# Patient Record
Sex: Female | Born: 1950
Health system: Southern US, Community
[De-identification: ages and names within clinical notes are randomized; demographics above are authoritative.]

## PROBLEM LIST (undated history)

## (undated) DIAGNOSIS — I219 Acute myocardial infarction, unspecified: Secondary | ICD-10-CM

## (undated) DIAGNOSIS — E781 Pure hyperglyceridemia: Secondary | ICD-10-CM

## (undated) DIAGNOSIS — T7840XA Allergy, unspecified, initial encounter: Secondary | ICD-10-CM

## (undated) HISTORY — DX: Allergy, unspecified, initial encounter: T78.40XA

## (undated) HISTORY — DX: Acute myocardial infarction, unspecified: I21.9

## (undated) HISTORY — PX: CHOLECYSTECTOMY: SHX55

## (undated) HISTORY — DX: Pure hyperglyceridemia: E78.1

## (undated) HISTORY — PX: BREAST CYST ASPIRATION: SHX578

---

## 1998-06-30 HISTORY — PX: OTHER SURGICAL HISTORY: SHX169

## 1999-08-09 ENCOUNTER — Other Ambulatory Visit: Admission: RE | Admit: 1999-08-09 | Discharge: 1999-08-09 | Payer: Self-pay | Admitting: Family Medicine

## 2000-08-16 ENCOUNTER — Other Ambulatory Visit: Admission: RE | Admit: 2000-08-16 | Discharge: 2000-08-16 | Payer: Self-pay | Admitting: Family Medicine

## 2000-08-23 DIAGNOSIS — M8580 Other specified disorders of bone density and structure, unspecified site: Secondary | ICD-10-CM

## 2001-09-11 ENCOUNTER — Other Ambulatory Visit: Admission: RE | Admit: 2001-09-11 | Discharge: 2001-09-11 | Payer: Self-pay | Admitting: Family Medicine

## 2003-12-09 ENCOUNTER — Other Ambulatory Visit: Payer: Self-pay

## 2004-11-08 ENCOUNTER — Ambulatory Visit: Payer: Self-pay | Admitting: Family Medicine

## 2005-01-25 ENCOUNTER — Ambulatory Visit: Payer: Self-pay | Admitting: Family Medicine

## 2005-01-25 ENCOUNTER — Other Ambulatory Visit: Admission: RE | Admit: 2005-01-25 | Discharge: 2005-01-25 | Payer: Self-pay | Admitting: Family Medicine

## 2005-02-13 ENCOUNTER — Ambulatory Visit: Payer: Self-pay | Admitting: Family Medicine

## 2005-02-17 ENCOUNTER — Ambulatory Visit: Payer: Self-pay | Admitting: Family Medicine

## 2005-02-28 ENCOUNTER — Other Ambulatory Visit: Payer: Self-pay

## 2005-03-01 ENCOUNTER — Ambulatory Visit: Payer: Self-pay | Admitting: Surgery

## 2005-03-20 ENCOUNTER — Ambulatory Visit: Payer: Self-pay | Admitting: Family Medicine

## 2005-07-19 ENCOUNTER — Ambulatory Visit: Payer: Self-pay | Admitting: General Surgery

## 2006-07-31 ENCOUNTER — Ambulatory Visit: Payer: Self-pay | Admitting: General Surgery

## 2006-08-22 ENCOUNTER — Encounter: Payer: Self-pay | Admitting: Family Medicine

## 2006-08-22 ENCOUNTER — Other Ambulatory Visit: Admission: RE | Admit: 2006-08-22 | Discharge: 2006-08-22 | Payer: Self-pay | Admitting: Family Medicine

## 2006-08-22 ENCOUNTER — Ambulatory Visit: Payer: Self-pay | Admitting: Family Medicine

## 2006-08-22 LAB — CONVERTED CEMR LAB: Pap Smear: NORMAL

## 2006-08-30 ENCOUNTER — Encounter: Payer: Self-pay | Admitting: Family Medicine

## 2006-08-30 LAB — CONVERTED CEMR LAB

## 2006-10-26 ENCOUNTER — Ambulatory Visit: Payer: Self-pay | Admitting: Family Medicine

## 2007-02-18 ENCOUNTER — Ambulatory Visit: Payer: Self-pay | Admitting: Family Medicine

## 2007-02-28 HISTORY — PX: GANGLION CYST EXCISION: SHX1691

## 2007-07-09 ENCOUNTER — Ambulatory Visit: Payer: Self-pay | Admitting: Family Medicine

## 2007-07-09 DIAGNOSIS — R1110 Vomiting, unspecified: Secondary | ICD-10-CM

## 2007-07-09 DIAGNOSIS — R197 Diarrhea, unspecified: Secondary | ICD-10-CM

## 2007-07-09 DIAGNOSIS — K219 Gastro-esophageal reflux disease without esophagitis: Secondary | ICD-10-CM

## 2007-07-09 HISTORY — DX: Diarrhea, unspecified: R19.7

## 2007-08-27 ENCOUNTER — Ambulatory Visit: Payer: Self-pay | Admitting: Gastroenterology

## 2007-08-27 ENCOUNTER — Encounter: Payer: Self-pay | Admitting: Family Medicine

## 2007-09-03 ENCOUNTER — Ambulatory Visit: Payer: Self-pay | Admitting: General Surgery

## 2008-04-20 IMAGING — MG UNKNOWN MG STUDY
1 series · 4 of 4 positions shown · non-contrast
Comparison: none

REASON FOR EXAM: Screening mammogram
COMMENTS:

[R CC · right · 4 of 4 slices shown]
[im 1/4]
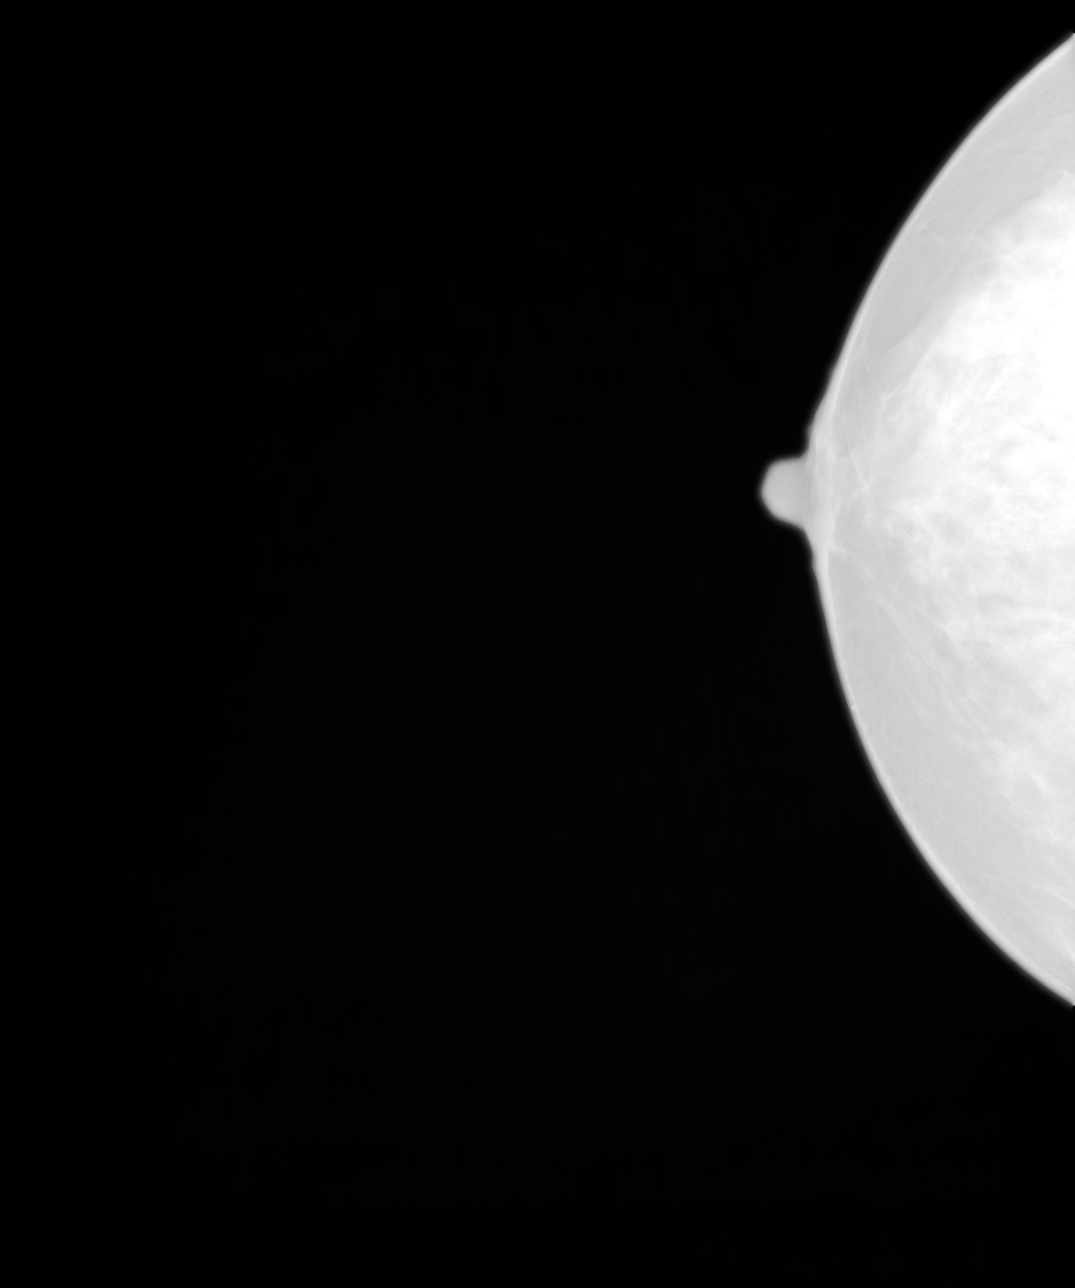
[im 2/4]
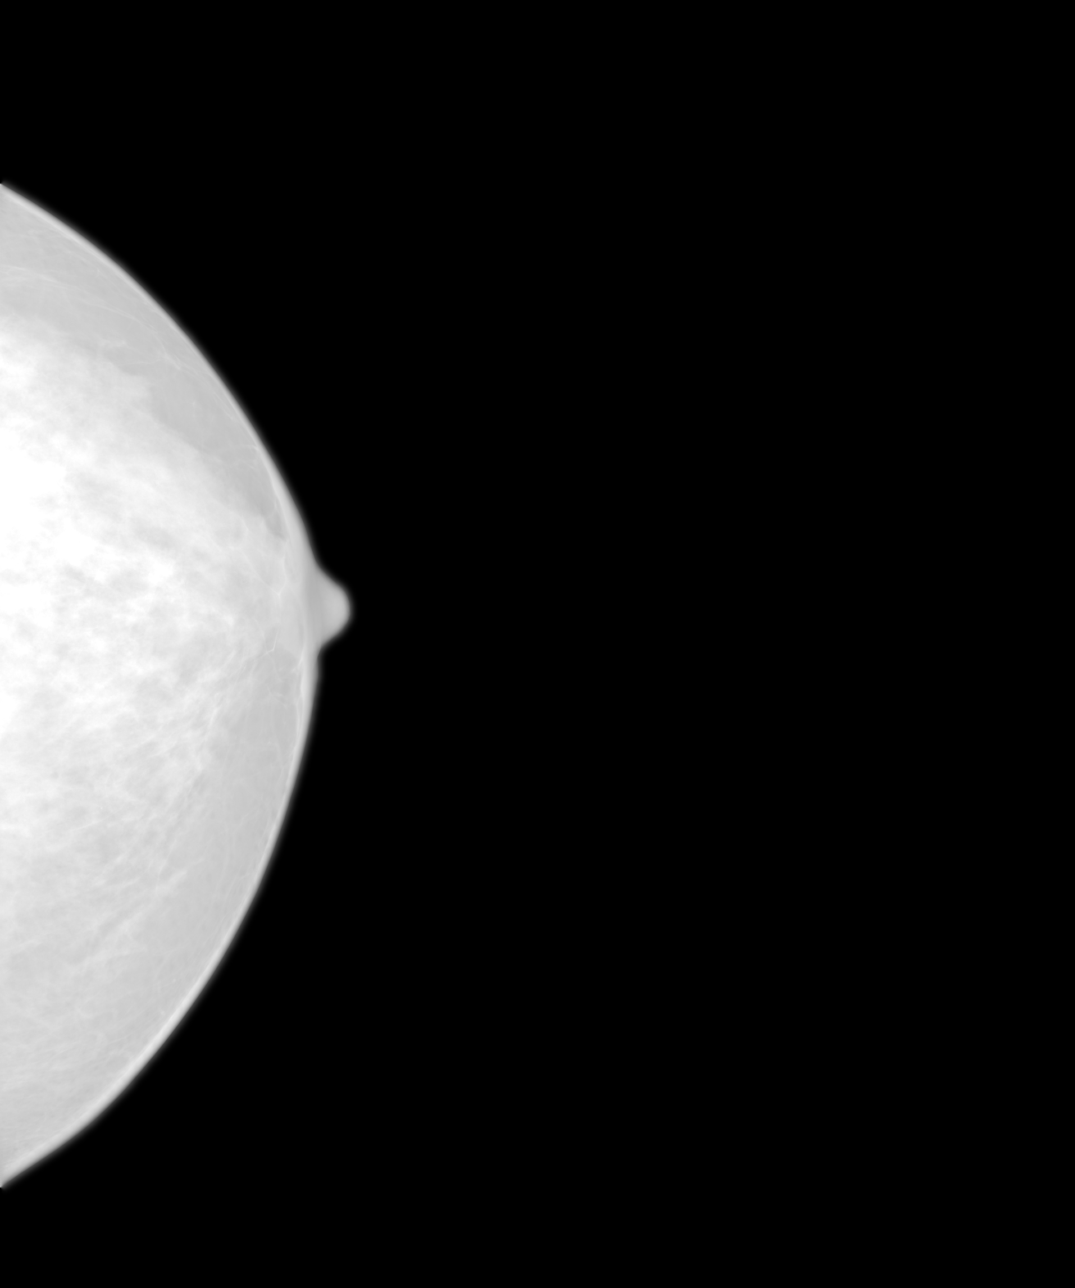
[im 3/4]
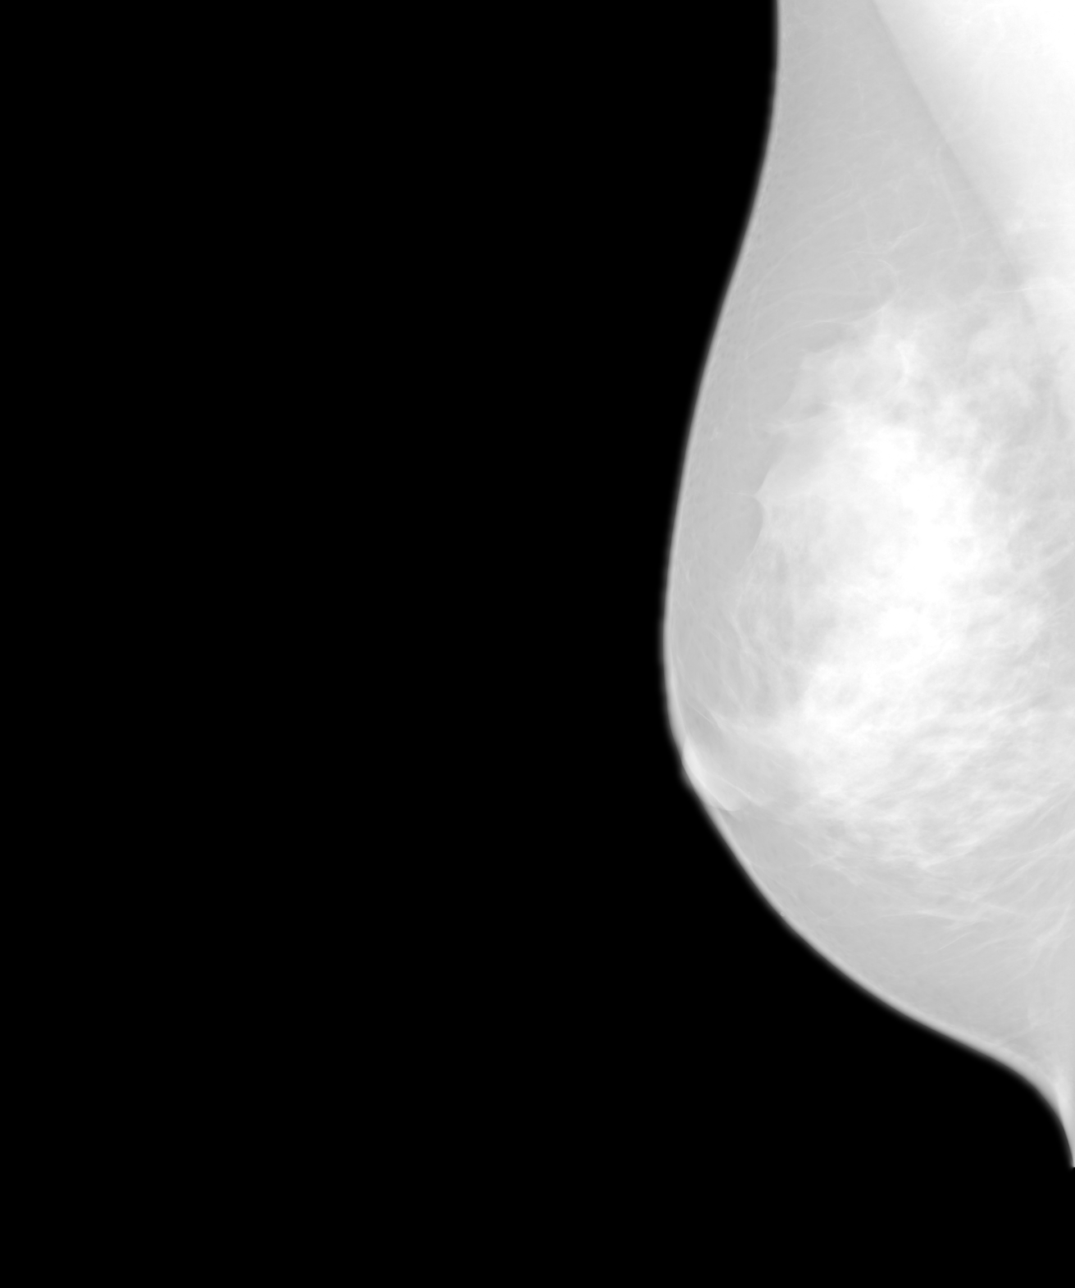
[im 4/4]
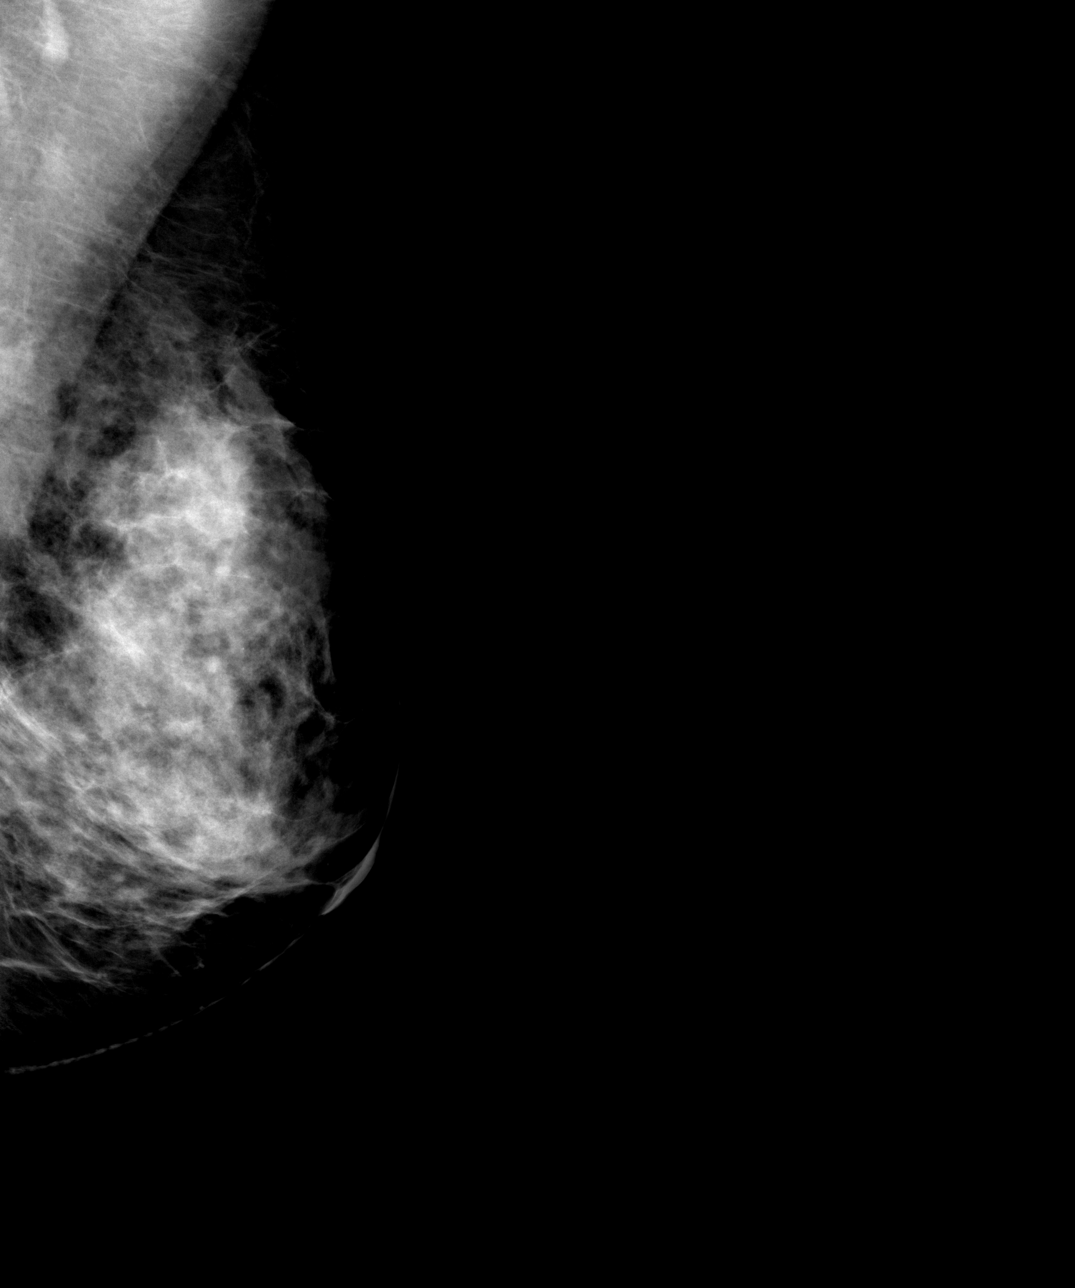

[4 of 4 positions shown; findings below may reference images not displayed]

PROCEDURE:     MAM - MAM DGTL SCREENING MAMMO W/CAD  - July 31, 2006  [DATE]

RESULT:        Comparison is made to a prior digital study 19 July, 2005,09 June, 2004 and 15 May, 2002.  A film screen study 13 May, 1999
is reviewed as well.  The breasts exhibit a moderately dense parenchymal
pattern.  There is no dominant mass.  There are no malignant-appearing
groupings of microcalcification.
IMPRESSION: 1.     I do not see findings suspicious for malignancy.
2.     BI-RADS:  Category 2- Benign Finding.

RECOMMENDATION:   Please continue to encourage yearly mammographic followup.

A NEGATIVE MAMMOGRAM REPORT DOES NOT PRECLUDE BIOPSY OR OTHER EVALUATION OF
A CLINICALLY PALPABLE OR OTHERWISE SUSPICOUS MASS OR LESION.  BREAST CANCER
MAY NOT BE DETECTED BY MAMMOGRAPHY IN UP TO 10% OF CASES.

## 2008-06-16 ENCOUNTER — Encounter (INDEPENDENT_AMBULATORY_CARE_PROVIDER_SITE_OTHER): Payer: Self-pay | Admitting: *Deleted

## 2008-12-01 ENCOUNTER — Ambulatory Visit: Payer: Self-pay | Admitting: General Surgery

## 2008-12-08 ENCOUNTER — Encounter: Payer: Self-pay | Admitting: Family Medicine

## 2009-03-02 ENCOUNTER — Ambulatory Visit: Payer: Self-pay | Admitting: Family Medicine

## 2009-03-02 DIAGNOSIS — I152 Hypertension secondary to endocrine disorders: Secondary | ICD-10-CM | POA: Insufficient documentation

## 2009-03-02 DIAGNOSIS — I10 Essential (primary) hypertension: Secondary | ICD-10-CM

## 2009-03-04 ENCOUNTER — Ambulatory Visit: Payer: Self-pay | Admitting: Family Medicine

## 2009-03-04 ENCOUNTER — Encounter (INDEPENDENT_AMBULATORY_CARE_PROVIDER_SITE_OTHER): Payer: Self-pay | Admitting: Internal Medicine

## 2009-03-05 LAB — CONVERTED CEMR LAB
ALT: 23 units/L (ref 0–35)
AST: 21 units/L (ref 0–37)
Albumin: 3.9 g/dL (ref 3.5–5.2)
Alkaline Phosphatase: 115 units/L (ref 39–117)
BUN: 13 mg/dL (ref 6–23)
Basophils Absolute: 0.1 10*3/uL (ref 0.0–0.1)
Basophils Relative: 0.8 % (ref 0.0–3.0)
Bilirubin, Direct: 0 mg/dL (ref 0.0–0.3)
CO2: 28 meq/L (ref 19–32)
Calcium: 9.1 mg/dL (ref 8.4–10.5)
Chloride: 104 meq/L (ref 96–112)
Cholesterol: 188 mg/dL (ref 0–200)
Creatinine, Ser: 1 mg/dL (ref 0.4–1.2)
Direct LDL: 78.1 mg/dL
Eosinophils Absolute: 0.2 10*3/uL (ref 0.0–0.7)
Eosinophils Relative: 2.4 % (ref 0.0–5.0)
GFR calc non Af Amer: 60.25 mL/min (ref >60)
Glucose, Bld: 108 mg/dL — ABNORMAL HIGH (ref 70–99)
HCT: 38.4 % (ref 36.0–46.0)
HDL: 28.6 mg/dL — ABNORMAL LOW (ref >39.00)
Hemoglobin: 13.5 g/dL (ref 12.0–15.0)
Lymphocytes Relative: 31.1 % (ref 12.0–46.0)
Lymphs Abs: 2.6 10*3/uL (ref 0.7–4.0)
MCHC: 35.1 g/dL (ref 30.0–36.0)
MCV: 89 fL (ref 78.0–100.0)
Monocytes Absolute: 0.8 10*3/uL (ref 0.1–1.0)
Monocytes Relative: 9.4 % (ref 3.0–12.0)
Neutro Abs: 4.7 10*3/uL (ref 1.4–7.7)
Neutrophils Relative %: 56.3 % (ref 43.0–77.0)
Platelets: 359 10*3/uL (ref 150.0–400.0)
Potassium: 4.3 meq/L (ref 3.5–5.1)
RBC: 4.32 M/uL (ref 3.87–5.11)
RDW: 12.1 % (ref 11.5–14.6)
Sodium: 141 meq/L (ref 135–145)
TSH: 1.57 microintl units/mL (ref 0.35–5.50)
Total Bilirubin: 0.8 mg/dL (ref 0.3–1.2)
Total CHOL/HDL Ratio: 7
Total Protein: 7.6 g/dL (ref 6.0–8.3)
Triglycerides: 521 mg/dL — ABNORMAL HIGH (ref 0.0–149.0)
VLDL: 104.2 mg/dL — ABNORMAL HIGH (ref 0.0–40.0)
Vit D, 25-Hydroxy: 19 ng/mL — ABNORMAL LOW (ref 30–89)
WBC: 8.4 10*3/uL (ref 4.5–10.5)

## 2009-03-08 ENCOUNTER — Encounter (INDEPENDENT_AMBULATORY_CARE_PROVIDER_SITE_OTHER): Payer: Self-pay | Admitting: *Deleted

## 2009-03-10 ENCOUNTER — Encounter: Payer: Self-pay | Admitting: Family Medicine

## 2009-03-10 DIAGNOSIS — Z87898 Personal history of other specified conditions: Secondary | ICD-10-CM | POA: Insufficient documentation

## 2009-03-10 DIAGNOSIS — J309 Allergic rhinitis, unspecified: Secondary | ICD-10-CM | POA: Insufficient documentation

## 2009-03-10 DIAGNOSIS — I252 Old myocardial infarction: Secondary | ICD-10-CM | POA: Insufficient documentation

## 2009-03-10 DIAGNOSIS — E781 Pure hyperglyceridemia: Secondary | ICD-10-CM | POA: Insufficient documentation

## 2009-04-01 ENCOUNTER — Ambulatory Visit: Payer: Self-pay | Admitting: Family Medicine

## 2009-04-01 DIAGNOSIS — E559 Vitamin D deficiency, unspecified: Secondary | ICD-10-CM

## 2009-04-01 DIAGNOSIS — E786 Lipoprotein deficiency: Secondary | ICD-10-CM | POA: Insufficient documentation

## 2009-04-12 ENCOUNTER — Ambulatory Visit: Payer: Self-pay | Admitting: Internal Medicine

## 2009-04-12 ENCOUNTER — Encounter (INDEPENDENT_AMBULATORY_CARE_PROVIDER_SITE_OTHER): Payer: Self-pay | Admitting: Internal Medicine

## 2009-04-27 ENCOUNTER — Telehealth (INDEPENDENT_AMBULATORY_CARE_PROVIDER_SITE_OTHER): Payer: Self-pay | Admitting: Internal Medicine

## 2009-04-27 ENCOUNTER — Encounter (INDEPENDENT_AMBULATORY_CARE_PROVIDER_SITE_OTHER): Payer: Self-pay | Admitting: Internal Medicine

## 2009-05-13 ENCOUNTER — Ambulatory Visit: Payer: Self-pay | Admitting: Family Medicine

## 2009-05-14 LAB — CONVERTED CEMR LAB
Cholesterol: 182 mg/dL (ref 0–200)
VLDL: 88.8 mg/dL — ABNORMAL HIGH (ref 0.0–40.0)

## 2009-06-23 ENCOUNTER — Encounter (INDEPENDENT_AMBULATORY_CARE_PROVIDER_SITE_OTHER): Payer: Self-pay | Admitting: Internal Medicine

## 2009-06-23 ENCOUNTER — Ambulatory Visit: Payer: Self-pay | Admitting: Family Medicine

## 2009-06-23 DIAGNOSIS — M6740 Ganglion, unspecified site: Secondary | ICD-10-CM | POA: Insufficient documentation

## 2009-06-24 LAB — CONVERTED CEMR LAB
BUN: 17 mg/dL (ref 6–23)
Calcium: 9.2 mg/dL (ref 8.4–10.5)
Creatinine, Ser: 1 mg/dL (ref 0.4–1.2)
GFR calc non Af Amer: 60.19 mL/min (ref >60)
Glucose, Bld: 101 mg/dL — ABNORMAL HIGH (ref 70–99)
HDL: 29.7 mg/dL — ABNORMAL LOW (ref >39.00)
LDL Cholesterol: 135 mg/dL — ABNORMAL HIGH (ref 0–99)
Total CHOL/HDL Ratio: 6
Triglycerides: 139 mg/dL (ref 0.0–149.0)
VLDL: 27.8 mg/dL (ref 0.0–40.0)

## 2009-11-11 LAB — HM MAMMOGRAPHY: HM Mammogram: NORMAL

## 2009-12-03 ENCOUNTER — Ambulatory Visit: Payer: Self-pay | Admitting: General Surgery

## 2009-12-13 ENCOUNTER — Encounter: Payer: Self-pay | Admitting: Family Medicine

## 2009-12-13 ENCOUNTER — Telehealth: Payer: Self-pay | Admitting: Family Medicine

## 2010-05-11 ENCOUNTER — Other Ambulatory Visit: Admission: RE | Admit: 2010-05-11 | Discharge: 2010-05-11 | Payer: Self-pay | Admitting: Family Medicine

## 2010-05-11 ENCOUNTER — Ambulatory Visit: Payer: Self-pay | Admitting: Family Medicine

## 2010-05-11 LAB — CONVERTED CEMR LAB
ALT: 19 units/L (ref 0–35)
Alkaline Phosphatase: 92 units/L (ref 39–117)
BUN: 20 mg/dL (ref 6–23)
Bilirubin, Direct: 0.1 mg/dL (ref 0.0–0.3)
Chloride: 102 meq/L (ref 96–112)
Creatinine, Ser: 1.1 mg/dL (ref 0.4–1.2)
Direct LDL: 103.1 mg/dL
GFR calc non Af Amer: 56.11 mL/min (ref >60)
Total Protein: 7.9 g/dL (ref 6.0–8.3)

## 2010-05-19 ENCOUNTER — Encounter (INDEPENDENT_AMBULATORY_CARE_PROVIDER_SITE_OTHER): Payer: Self-pay | Admitting: *Deleted

## 2010-07-20 ENCOUNTER — Ambulatory Visit: Payer: Self-pay | Admitting: Family Medicine

## 2010-07-20 ENCOUNTER — Telehealth (INDEPENDENT_AMBULATORY_CARE_PROVIDER_SITE_OTHER): Payer: Self-pay | Admitting: *Deleted

## 2010-07-21 ENCOUNTER — Encounter (INDEPENDENT_AMBULATORY_CARE_PROVIDER_SITE_OTHER): Payer: Self-pay | Admitting: *Deleted

## 2010-07-21 ENCOUNTER — Telehealth: Payer: Self-pay | Admitting: Family Medicine

## 2010-07-21 LAB — CONVERTED CEMR LAB
Hgb A1c MFr Bld: 6.4 % (ref 4.6–6.5)
Total CHOL/HDL Ratio: 7
Triglycerides: 238 mg/dL — ABNORMAL HIGH (ref 0.0–149.0)
VLDL: 47.6 mg/dL — ABNORMAL HIGH (ref 0.0–40.0)

## 2010-08-22 IMAGING — MG MAM DGTL SCREENING MAMMO W/CAD
1 series · 4 of 4 positions shown · non-contrast
Comparison: none

REASON FOR EXAM: scr mammo
COMMENTS:

[R CC · right · 4 of 4 slices shown]
[im 1/4]
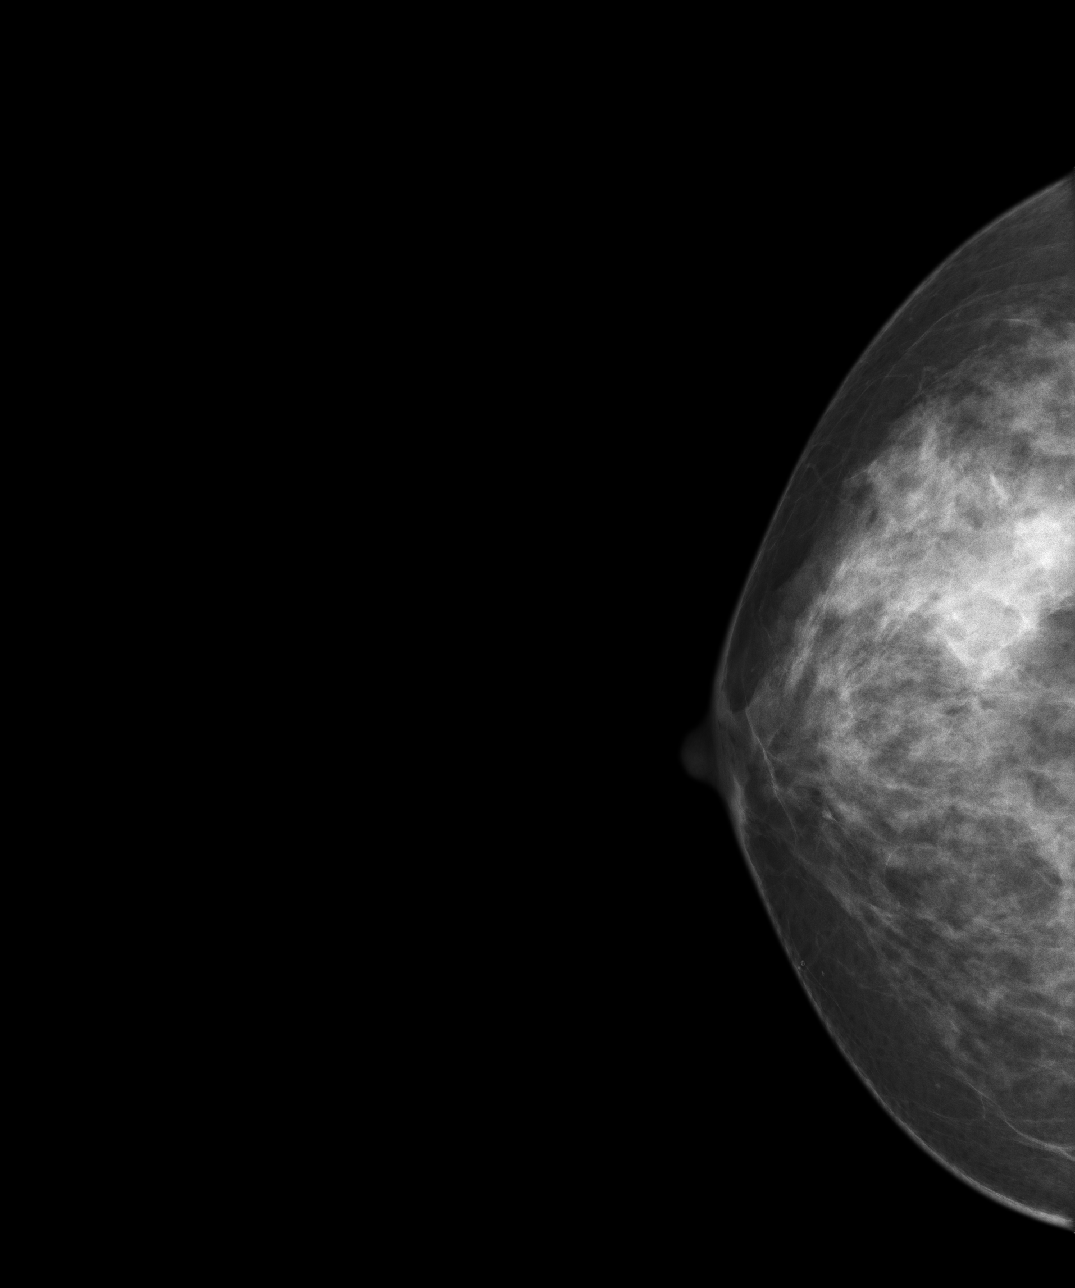
[im 2/4]
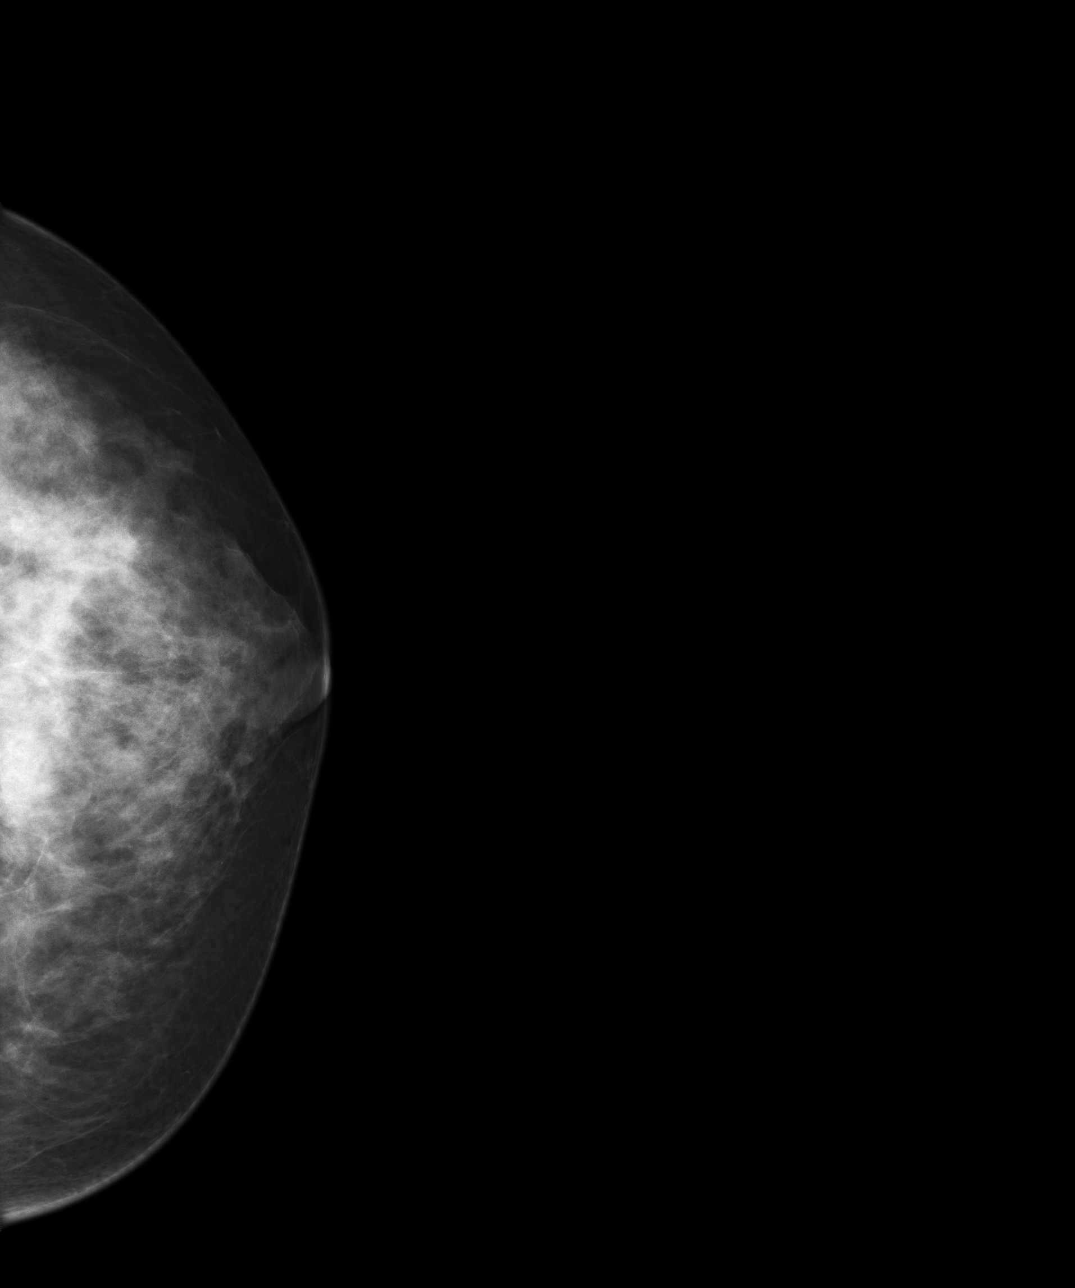
[im 3/4]
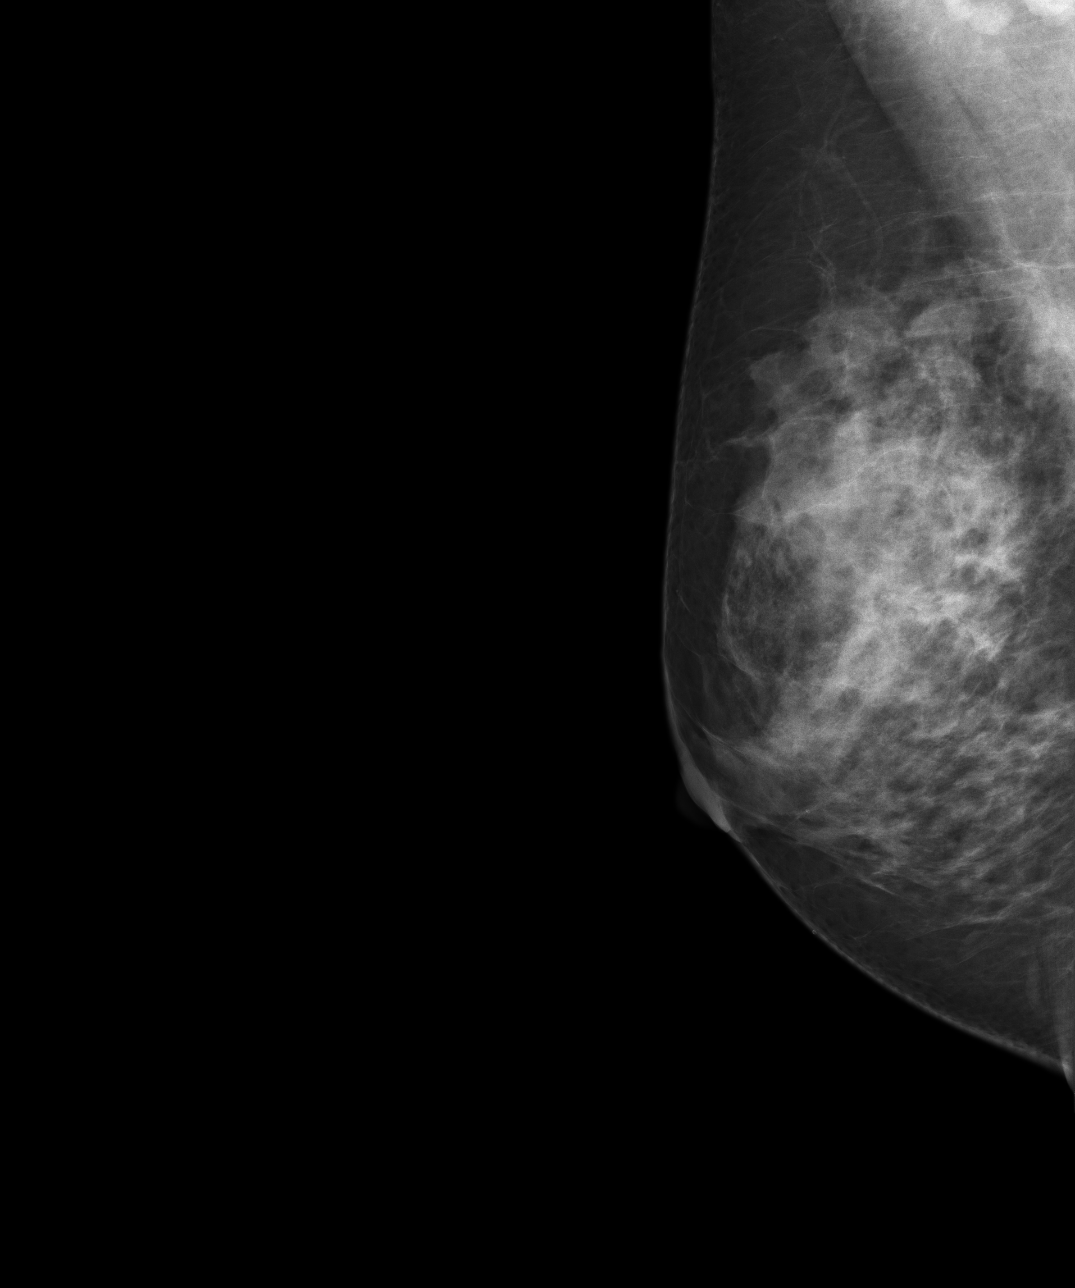
[im 4/4]
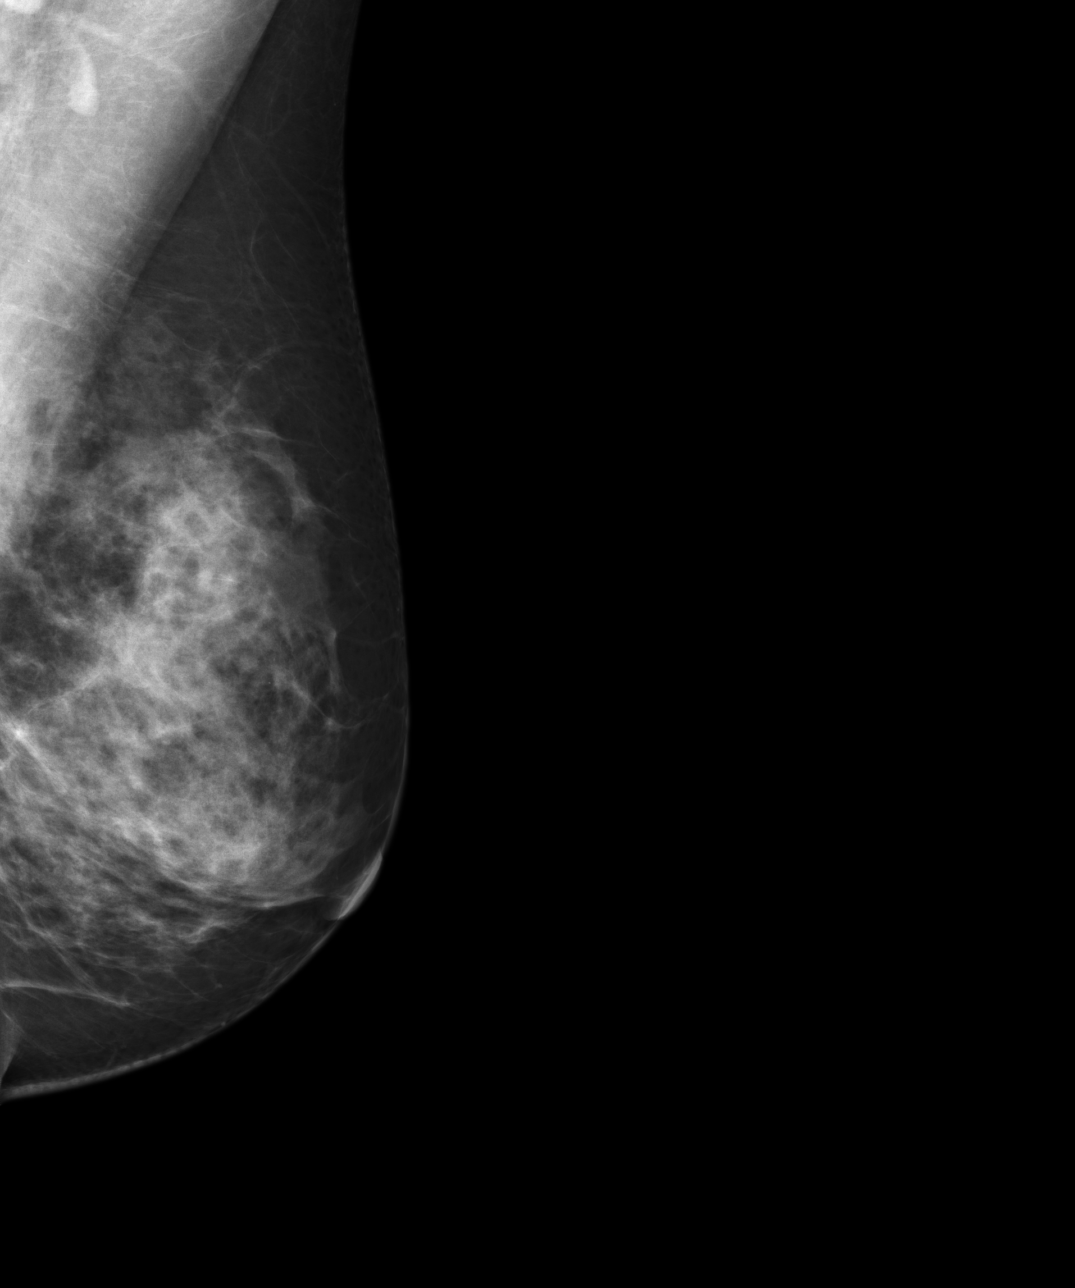

[4 of 4 positions shown; findings below may reference images not displayed]

PROCEDURE:     MAM - MAM DGTL SCREENING MAMMO W/CAD  - December 01, 2008  [DATE]

RESULT:     Comparison is made to prior examinations of 09/03/2007,
07/31/2006 and 05/15/2002.

The breast parenchyma is bilaterally dense which limits the sensitivity of
mammography. No mass or malignant appearing calcifications are seen.
IMPRESSION: 1.     Bilaterally benign appearing screening mammography.
2.     Continued annual screening mammography is recommended.
3.     BI-RADS: Category 1 - Negative

## 2010-11-29 NOTE — Progress Notes (Signed)
Summary: regarding metformin refills  ---- Converted from flag ---- ---- 07/21/2010 10:14 AM, Ruthe Mannan MD wrote: yes please.  thanks.  6 refills is fine.  ---- 07/21/2010 10:03 AM, Lowella Petties CMA wrote: Dr. Dayton Martes, I gave pt her lab results.  You had ordered 60 metformin, no refills, but that wont be enough to last her until her next A1C in 8 weeks.  Do you want to add refills? ------------------------------

## 2010-11-29 NOTE — Miscellaneous (Signed)
Summary: med list update- metformin  Clinical Lists Changes  Medications: Changed medication from METFORMIN HCL 500 MG TABS (METFORMIN HCL) take one tablet by mouth once daily to METFORMIN HCL 500 MG TABS (METFORMIN HCL) take one tablet by mouth twice a day     Prior Medications: ATENOLOL 50 MG TABS (ATENOLOL) one by mouth daily QUINAPRIL HCL 10 MG TABS (QUINAPRIL HCL) one by mouth daily TRILIPIX 135 MG CPDR (CHOLINE FENOFIBRATE) 1 once daily for triglycerides VITAMIN D 1000MG  () 1 once daily CALCIUM 600 PLUS D () take 1 two times a day with meals ZEGERID OTC 20-1100 MG CAPS (OMEPRAZOLE-SODIUM BICARBONATE) take one capsule by mouth every morning 30- 60 mins. before food or fluid for reflux Current Allergies: ! ZOCOR ! LIPITOR ! * CRESTOR ! * ZETIA ! FOSAMAX

## 2010-11-29 NOTE — Miscellaneous (Signed)
Summary: Zegerid update  Clinical Lists Changes  Medications: Removed medication of * ZEGERID 40-1100 MG CAPS (OMEPRAZOLE-SODIUM BICARBONATE)OTC 1 each morning 30-9min before more food/fluids for reflux     Prior Medications: ATENOLOL 50 MG TABS (ATENOLOL) one by mouth daily QUINAPRIL HCL 10 MG TABS (QUINAPRIL HCL) one by mouth daily TRILIPIX 135 MG CPDR (CHOLINE FENOFIBRATE) 1 once daily for triglycerides VITAMIN D 1000MG  () 1 once daily CALCIUM 600 PLUS D () take 1 two times a day with meals ZEGERID OTC 20-1100 MG CAPS (OMEPRAZOLE-SODIUM BICARBONATE) take one capsule by mouth every morning 30- 60 mins. before food or fluid for reflux Current Allergies: ! ZOCOR ! LIPITOR ! * CRESTOR ! * ZETIA ! FOSAMAX

## 2010-11-29 NOTE — Assessment & Plan Note (Signed)
Summary: TRANSFER FROM [facility]/CHECK BP/CLE   Vital Signs:  Patient profile:   60 year old female Height:      64.25 inches Weight:      133 pounds BMI:     22.73 Temp:     98.1 degrees F oral Pulse rate:   68 / minute Pulse rhythm:   regular BP sitting:   140 / 90  (left arm) Cuff size:   regular  Vitals Entered By: Linde Gillis CMA Duncan Dull) (May 11, 2010 8:21 AM) CC: transfer from [place], [place]   History of Present Illness: 60 yo new to me here for CPX.  HLD- last checked in 06/23/2009 shortly after starting Trilipix 135 mg daily. TG in August were 139 ( were 445 in 04/2009).  LDL 135, HDL 29. She has been working on her diet. Does have a h/o MI in her 36s.  HTN- has documented white coat HTN and has not taken meds today. On Atenolol 50 daily and Quinapril 10 mg daily.  Well woman- due for pap, Zostavax, FLP, hepatic panel. UTD on all other prevention.  Current Medications (verified): 1)  Atenolol 50 Mg Tabs (Atenolol) .... One By Mouth Daily 2)  Quinapril Hcl 10 Mg Tabs (Quinapril Hcl) .... One By Mouth Daily 3)  Trilipix 135 Mg Cpdr (Choline Fenofibrate) .Marland Kitchen.. 1 Once Daily For Triglycerides 4)  Vitamin D 1000mg  .... 1 Once Daily 5)  Calcium 600 Plus D .... Take 1 Two Times A Day With Meals 6)  Zegerid Otc 20-1100 Mg Caps (Omeprazole-Sodium Bicarbonate) .... Take One Capsule By Mouth Every Morning 30- 60 Mins. Before Food or Fluid For Reflux  Allergies: 1)  ! Zocor 2)  ! Lipitor 3)  ! * Crestor 4)  ! * Zetia 5)  ! Fosamax  Past History:  Past Medical History: Last updated: 03/10/2009 Myocardial infarction, hx of Allergic rhinitis  Past Surgical History: Last updated: 03/10/2009 colonoscopy 10/08 rectal polyps EGD 10/08 single gastric polyp Endocervical polyps removed 09/99 Breast cyst aspiration on left Dexa-osteopenia 10/01 Cardiac cath minimal CAD 02/05 Stress test normal 02/05 Echo negative 02/05 Stress test normal EF 59% Cardiac cath stable RCA  02/05 Abd [place] - cholelithiasis 04/06 Cholecystectomy 05/06 Ganglion L knee 05/08  Family History: Last updated: 2009/04/22 Father: died at age 43 of throat cancer,  Mother: colon cancer, HBP, DM Siblings: 2 br--1 --heart problems                        1--L&W--anemia               1 sis--fibromyalgia DM-0 MI- CVA- grandparents, aunt died with MI Prostate Cancer- Breast Cancer-MGM Ovarian Cancer- Uterine Cancer- Colon Cancer-0 Drug/ ETOH Abuse- son Depression-   Social History: Last updated: 03/02/2009 Marital Status: Married Children: 2--2 grandchildren 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 Eyes:  Denies blurring. ENT:  Denies difficulty swallowing. CV:  Denies chest pain or discomfort and shortness of breath with exertion. Resp:  Denies shortness of breath. GI:  Denies abdominal pain and bloody stools. GU:  Denies abnormal vaginal bleeding and decreased libido. MS:  Denies joint pain, joint redness, and joint swelling. Derm:  Denies rash. Neuro:  Denies headaches. Psych:  Denies anxiety and depression. Endo:  Denies cold intolerance and heat intolerance.  Physical Exam  General:  alert, well-developed, well-nourished, and well-hydrated.   Head:  normocephalic, atraumatic, and no abnormalities observed.   Eyes:  pupils equal, pupils round, and no injection.   Ears:  R ear normal and L ear normal.   Nose:  no external deformity.   Mouth:  good dentition.   Neck:  no masses, no thyromegaly, no JVD, and no carotid bruits.   Breasts:  No mass, nodules, thickening, tenderness, bulging, retraction, inflamation, nipple discharge or  skin changes noted.   Lungs:  normal respiratory effort, no intercostal retractions, no accessory muscle use, and normal breath sounds.   Heart:  normal rate, regular rhythm, and no murmur.   Abdomen:  soft, non-tender, normal bowel sounds, no distention, no masses, no guarding, no abdominal hernia, no inguinal hernia, no hepatomegaly, and no splenomegaly.   Genitalia:  Pelvic Exam:        External: normal female genitalia without lesions or masses        Vagina: normal without lesions or masses        Cervix: normal without lesions or masses        Adnexa: normal bimanual exam without masses or fullness        Uterus: normal by palpation        Pap smear: performed Msk:  L lateral knee,soft mass at joint line, feels to be full of fluid, not tender, no eryth full ROM of L knee Extremities:  no edema either lower leg Skin:  turgor normal, color normal, and no rashes.   Psych:  normally interactive and good eye contact.     Impression & Recommendations:  Problem # 1:  Preventive Health Care (ICD-V70.0) Reviewed preventive care protocols, scheduled due services, and updated immunizations Discussed nutrition, exercise, diet, and healthy lifestyle.  Pap today. FLP, BMET today. She wants to think about Zostavax. UTD on colonscopy, dexa scan (see [facility] form).  Problem # 2:  HYPERTENSION, BENIGN ESSENTIAL (ICD-401.1) Assessment: Unchanged h/o white coat HTN and has not taken meds today.  Continue to follow. Her updated medication list for this problem includes:    Atenolol 50 Mg Tabs (Atenolol) ..... One by mouth daily    Quinapril Hcl 10 Mg Tabs (Quinapril hcl) ..... One by mouth daily  Orders: Venipuncture ([address]) TLB-BMP (Basic Metabolic Panel-BMET) (80048-METABOL)  Problem # 3:  PURE HYPERGLYCERIDEMIA (ICD-272.1) Assessment: Unchanged with low HDL.  Recheck FLP today. Her updated medication list for this problem includes:    Trilipix 135 Mg Cpdr (Choline fenofibrate) .Marland Kitchen... 1  once daily for triglycerides  Orders: Venipuncture ([address]) TLB-Lipid Panel (80061-LIPID) TLB-Hepatic/Liver Function Pnl (80076-HEPATIC)  Complete Medication List: 1)  Atenolol 50 Mg Tabs (Atenolol) .... One by mouth daily 2)  Quinapril Hcl 10 Mg Tabs (Quinapril hcl) .... One by mouth daily 3)  Trilipix 135 Mg Cpdr (Choline fenofibrate) .Marland Kitchen.. 1 once daily for triglycerides 4)  Vitamin D 1000mg   .... 1 once daily 5)  Calcium 600 Plus D  .... Take 1 two times a day with meals 6)  Zegerid Otc 20-1100 Mg Caps (Omeprazole-sodium bicarbonate) .... Take one capsule by mouth every morning 30- 60 mins. before food or fluid for reflux  Other Orders: T-Vitamin D (25-Hydroxy) (09811-91478) Prescriptions: TRILIPIX 135 MG CPDR (CHOLINE FENOFIBRATE) 1 once daily for triglycerides  #90 x 6   Entered and Authorized by:   Jovita Gamma  Dayton Martes MD   Signed by:   Ruthe Mannan MD on 05/11/2010   Method used:   [facility] to        [id number] (retail[facility]       [address]       [place], [place]  [address]       Ph: [phone] or [phone]       Fax: [phone]   RxID:   [id number] QUINAPRIL HCL 10 MG TABS (QUINAPRIL HCL) one by mouth daily  #90 x 3   Entered and Authorized by:   Ruthe Mannan MD   Signed by:   Ruthe Mannan MD on 05/11/2010   Method used:   [facility] to        [id number] ([facility]ail[facility]       [address]       [place], [place]  [address]       Ph: [phone] or [phone]       Fax: [phone]   RxID:   [id number] ATENOLOL 50 MG TABS (ATENOLOL) one by mouth daily  #90 x 3   Entered and Authorized by:   Ruthe Mannan MD   Signed by:   Ruthe Mannan MD on 05/11/2010   Method used:   [facility]        [id number] ([facility]       [address]       [place], [place]  [address]       Ph: [phone] or [phone]       Fax: [phone]   RxID:   [id number]   Current Allergies  (reviewed today): ! ZOCOR ! LIPITOR ! * CRESTOR ! * ZETIA ! FOSAMAX  Last Colonoscopy:  Adenomatous Polyp (08/27/2007 8:30:07 PM) Colonoscopy Next Due:  5 yr Last Hemoccult Result: Negative (02/13/2005 11:00:17 AM) Hemoccult Next Due:  Not Indicated Last Mammogram:  Category 2 (11/20/2008 9:28:46 AM) Mammogram Result Date:  11/11/2009 Mammogram Result:  normal Mammogram Next Due:  1 yr      Prevention & Chronic Care Immunizations   Influenza vaccine: Not documented    Tetanus booster: 03/04/2009: Tdap   Tetanus booster due: 03/05/2019    Pneumococcal vaccine: Pneumovax  (08/16/2000)   Pneumococcal vaccine due: None    H. zoster vaccine: Not documented   H. zoster vaccine deferral: Deferred  (05/11/2010)  Colorectal Screening   Hemoccult: Negative  (02/13/2005)   Hemoccult due: Not Indicated    Colonoscopy: Adenomatous Polyp  (08/27/2007)   Colonoscopy due: 08/26/2012  Other Screening   Pap smear: Done/GYN  (08/30/2006)   Pap smear action/deferral: Ordered  (05/11/2010)   Pap smear due: 08/31/2007    Mammogram: normal  (11/11/2009)   Mammogram due: 11/11/2010    DXA bone density scan: abnormal  (04/26/2009)   DXA scan due: 04/2011    Smoking status: never  (04/01/2009)  Lipids   Total Cholesterol: 192  (06/23/2009)   Lipid panel action/deferral: Lipid Panel ordered   LDL: 135  (06/23/2009)   LDL Direct: 78.9  (05/13/2009)   HDL: 29.70  (06/23/2009)   Triglycerides: 139.0  (06/23/2009)    SGOT (AST): 27  (06/23/2009)   BMP action: Ordered   SGPT (ALT): 27  (06/23/2009)   Alkaline phosphatase: 115  (03/04/2009)  Total bilirubin: 0.8  (03/04/2009)    Lipid flowsheet reviewed?: Yes   Progress toward LDL goal: Improved  Hypertension   Last Blood Pressure: 140 / 90  (05/11/2010)   Serum creatinine: 1.0  (06/23/2009)   Serum potassium 4.4  (06/23/2009)    Hypertension flowsheet reviewed?: Yes   Progress toward BP goal: At goal   Hypertension  comments: documented white coat HTN, has not taken medicaiton today  Self-Management Support :    Hypertension self-management support: Not documented    Lipid self-management support: Not documented    Nursing Instructions: Pap smear today    Appended Document: TRANSFER FROM [facility]ILLIE/CHECK BP/CLE

## 2010-11-29 NOTE — Letter (Signed)
Summary: Results Follow up Letter  [facility] [facility]  [address] [place], [place] [address]   Phone: [phone]  Fax: [phone]    05/19/2010 MRN: [id number]  Dominique James [address] [place], [place]  [address]  Dear Ms. Zavalza,  The following are the results of your recent test(s):  Test         Result    Pap Smear:        Normal __x___  Not Normal _____ Comments: __Please call us if you have any questions! ____________________________________________________ Cholesterol: LDL(Bad cholesterol):         Your goal is less than:         HDL (Good cholesterol):       Your goal is more than: Comments:  ______________________________________________________ Mammogram:        Normal _____  Not Normal _____ Comments:  ___________________________________________________________________ Hemoccult:        Normal _____  Not normal _______ Comments:    _____________________________________________________________________ Other Tests:    We routinely do not discuss normal results over the telephone.  If you desire a copy of the results, or you have any questions about this information we can discuss them at your next office visit.   Sincerely,     Ruthe Mannan, MD

## 2010-11-29 NOTE — Miscellaneous (Signed)
Summary: Zegerid OTC 20-1100mg  rx  Clinical Lists Changes  Medications: Added new medication of ZEGERID OTC 20-1100 MG CAPS (OMEPRAZOLE-SODIUM BICARBONATE) take one capsule by mouth every morning 30- 60 mins. before food or fluid for reflux     Prior Medications: ATENOLOL 50 MG TABS (ATENOLOL) one by mouth daily QUINAPRIL HCL 10 MG TABS (QUINAPRIL HCL) one by mouth daily TRILIPIX 135 MG CPDR (CHOLINE FENOFIBRATE) 1 once daily for triglycerides ZEGERID 40-1100 MG CAPS (OMEPRAZOLE-SODIUM BICARBONATE)OTC () 1 each morning 30-28min before more food/fluids for reflux VITAMIN D 1000MG  () 1 once daily CALCIUM 600 PLUS D () take 1 two times a day with meals ZEGERID OTC 20-1100 MG CAPS (OMEPRAZOLE-SODIUM BICARBONATE) take one capsule by mouth every morning 30- 60 mins. before food or fluid for reflux Current Allergies: ! ZOCOR ! LIPITOR ! * CRESTOR ! * ZETIA ! FOSAMAX

## 2010-11-29 NOTE — Progress Notes (Signed)
----   Converted from flag ---- ---- 07/20/2010 8:08 AM, Ruthe Mannan MD wrote: that's perfect. thanks.  ---- 07/20/2010 7:46 AM, Liane Comber CMA (AAMA) wrote: Peri [place] morning Dr Dayton Martes, is there anything else you want checked for pt's labs? Thanks Tasha  ---- 07/19/2010 9:26 PM, Colon Flattery Tower MD wrote: I think she has switched to Dr Dayton Martes now - and looks like she wanted [facility] for hyperglycemia and lipid for 272-- but you may want to check with her thanks- MT  ---- 07/19/2010 9:22 AM, Liane Comber CMA (AAMA) wrote: Lab orders please! Good Morning! This pt is scheduled for labs tomorrow, which labs to draw and dx codes to use? Thanks Tasha ------------------------------

## 2010-11-29 NOTE — Progress Notes (Signed)
Summary: clarification of Zegerid 20/1100mg  rx  Phone Note Refill Request Call back at [phone] Message from:  [facility] on December 13, 2009 12:01 PM  Refills Requested: Medication #1:  ZEGERID OTC 20-1100 MG CAPS take one capsule by mouth every morning 30- 60 mins. before food or fluid for reflux. [facility] refill on Zegerid OTC 20-1100mg > I added to med list. We had the Zegerid 40mg -1100mg  on our med list but I spoke with pt and Zegerid 20/1100mg  OTC is what the insurance will pay for. Is it OK to remove the Zegerid 40/1100mg  and OK the 20/1100mg . Please advise.    Method Requested: Electronic Initial call taken by: Lewanda Rife LPN,  December 13, 2009 12:03 PM  Follow-up for Phone Call        can ok the 20/1100 1 month with 11 ref  Follow-up by: Judith Part MD,  December 13, 2009 1:52 PM    Prescriptions: ZEGERID OTC 20-1100 MG CAPS (OMEPRAZOLE-SODIUM BICARBONATE) take one capsule by mouth every morning 30- 60 mins. before food or fluid for reflux  #30 x 11   Entered by:   Lewanda Rife LPN   Authorized by:   Judith Part MD   Signed by:   Lewanda Rife LPN on 45/40/9811   Method used:   [facility]        [id number]       [address]       [place], [place]  [address]       Ph: [phone] or [phone]       Fax: [phone]   RxID:   [id number]

## 2011-06-27 ENCOUNTER — Other Ambulatory Visit: Payer: Self-pay | Admitting: Family Medicine

## 2011-06-28 ENCOUNTER — Other Ambulatory Visit: Payer: Self-pay | Admitting: *Deleted

## 2011-06-28 MED ORDER — ATENOLOL 50 MG PO TABS: 50.0000 mg | ORAL_TABLET | Freq: Every day | ORAL | Status: DC

## 2011-09-12 ENCOUNTER — Other Ambulatory Visit: Payer: Self-pay | Admitting: *Deleted

## 2011-09-12 MED ORDER — OMEPRAZOLE-SODIUM BICARBONATE 20-1100 MG PO CAPS: 1.0000 | ORAL_CAPSULE | Freq: Every day | ORAL | Status: DC

## 2011-11-01 ENCOUNTER — Other Ambulatory Visit: Payer: Self-pay | Admitting: Family Medicine

## 2011-11-01 DIAGNOSIS — E786 Lipoprotein deficiency: Secondary | ICD-10-CM

## 2011-11-01 DIAGNOSIS — E559 Vitamin D deficiency, unspecified: Secondary | ICD-10-CM

## 2011-11-01 DIAGNOSIS — E781 Pure hyperglyceridemia: Secondary | ICD-10-CM

## 2011-11-01 DIAGNOSIS — I10 Essential (primary) hypertension: Secondary | ICD-10-CM

## 2011-11-06 ENCOUNTER — Other Ambulatory Visit (INDEPENDENT_AMBULATORY_CARE_PROVIDER_SITE_OTHER): Payer: BC Managed Care – PPO

## 2011-11-06 DIAGNOSIS — E559 Vitamin D deficiency, unspecified: Secondary | ICD-10-CM

## 2011-11-06 DIAGNOSIS — E786 Lipoprotein deficiency: Secondary | ICD-10-CM

## 2011-11-06 DIAGNOSIS — E781 Pure hyperglyceridemia: Secondary | ICD-10-CM

## 2011-11-06 LAB — BASIC METABOLIC PANEL
BUN: 13 mg/dL (ref 6–23)
CO2: 26 mEq/L (ref 19–32)
Calcium: 9.4 mg/dL (ref 8.4–10.5)
Chloride: 107 mEq/L (ref 96–112)
Creatinine, Ser: 1 mg/dL (ref 0.4–1.2)
GFR: 62.6 mL/min (ref >60.00)
Glucose, Bld: 102 mg/dL — ABNORMAL HIGH (ref 70–99)
Potassium: 4.7 mEq/L (ref 3.5–5.1)
Sodium: 141 mEq/L (ref 135–145)

## 2011-11-06 LAB — LIPID PANEL
Cholesterol: 193 mg/dL (ref 0–200)
HDL: 35.1 mg/dL — ABNORMAL LOW (ref >39.00)
Total CHOL/HDL Ratio: 5
Triglycerides: 443 mg/dL — ABNORMAL HIGH (ref 0.0–149.0)
VLDL: 88.6 mg/dL — ABNORMAL HIGH (ref 0.0–40.0)

## 2011-11-07 ENCOUNTER — Other Ambulatory Visit: Payer: Self-pay | Admitting: *Deleted

## 2011-11-07 MED ORDER — VITAMIN D3 1.25 MG (50000 UT) PO CAPS: 1.0000 | ORAL_CAPSULE | ORAL | Status: DC

## 2011-11-09 ENCOUNTER — Encounter: Payer: Self-pay | Admitting: Family Medicine

## 2011-11-09 ENCOUNTER — Ambulatory Visit (INDEPENDENT_AMBULATORY_CARE_PROVIDER_SITE_OTHER): Payer: BC Managed Care – PPO | Admitting: Family Medicine

## 2011-11-09 VITALS — BP 120/82 | HR 76 | Temp 98.7°F | Ht 65.0 in | Wt 128.0 lb

## 2011-11-09 DIAGNOSIS — Z0000 Encounter for general adult medical examination without abnormal findings: Secondary | ICD-10-CM

## 2011-11-09 DIAGNOSIS — Z1231 Encounter for screening mammogram for malignant neoplasm of breast: Secondary | ICD-10-CM

## 2011-11-09 DIAGNOSIS — E785 Hyperlipidemia, unspecified: Secondary | ICD-10-CM | POA: Insufficient documentation

## 2011-11-09 DIAGNOSIS — I10 Essential (primary) hypertension: Secondary | ICD-10-CM

## 2011-11-09 DIAGNOSIS — Z0001 Encounter for general adult medical examination with abnormal findings: Secondary | ICD-10-CM | POA: Insufficient documentation

## 2011-11-09 DIAGNOSIS — E781 Pure hyperglyceridemia: Secondary | ICD-10-CM

## 2011-11-09 HISTORY — DX: Pure hyperglyceridemia: E78.1

## 2011-11-09 MED ORDER — QUINAPRIL HCL 10 MG PO TABS: 10.0000 mg | ORAL_TABLET | Freq: Every day | ORAL | Status: DC

## 2011-11-09 MED ORDER — OMEPRAZOLE-SODIUM BICARBONATE 20-1100 MG PO CAPS: 1.0000 | ORAL_CAPSULE | Freq: Every day | ORAL | Status: DC

## 2011-11-09 MED ORDER — ATENOLOL 50 MG PO TABS: 50.0000 mg | ORAL_TABLET | Freq: Every day | ORAL | Status: DC

## 2011-11-09 MED ORDER — CHOLINE FENOFIBRATE 135 MG PO CPDR: 135.0000 mg | DELAYED_RELEASE_CAPSULE | Freq: Every day | ORAL | Status: DC

## 2011-11-09 NOTE — Patient Instructions (Signed)
Great to see you. Please stop by to see Shirlee Limerick on your way out to set up your mammogram. Please stop taking your blood pressure medication. Call me in 2 weeks with an update of your blood pressure. Please restart Trilipix in return in 3-6 months for repeat labs.

## 2011-11-09 NOTE — Progress Notes (Signed)
62 yo here for CPX.  HLD-  Lab Results  Component Value Date   CHOL 193 11/06/2011   HDL 35.10* 11/06/2011   LDLCALC 135* 06/23/2009   LDLDIRECT 76.9 11/06/2011   TRIG 443.0 Triglyceride is over 400; calculations on Lipids are invalid.* 11/06/2011   CHOLHDL 5 11/06/2011    TG very elevated. Stopped taking her Trilipix. She has been working on her diet and walking two miles per day! Does have a h/o MI in her 47s.  HTN- On Atenolol 50 daily and Quinapril 10 mg daily. Past six months, has noticed a dry, nagging cough. Non productive. No fevers, no CP, no SOB.  Well woman- due for mammogram and Zostavax. UTD on all other prevention. Patient Active Problem List  Diagnoses  . UNSPECIFIED VITAMIN D DEFICIENCY  . Pure hyperglyceridemia  . LOW HDL  . HYPERTENSION, BENIGN ESSENTIAL  . MYOCARDIAL INFARCTION, HX OF  . ALLERGIC RHINITIS  . G E R D  . GANGLION OF JOINT  . OSTEOPENIA  . EMESIS  . DIARRHEA  . FIBROCYSTIC BREAST DISEASE, HX OF  . Routine general medical examination at a health care facility   Past Medical History  Diagnosis Date  . Myocardial infarction   . Allergy    Past Surgical History  Procedure Date  . Endocervical polyp 06/1998    removed  . Breast cyst aspiration     left  . Cholecystectomy   . Ganglion cyst excision 02/2007    left knee   History  Substance Use Topics  . Smoking status: Not on file  . Smokeless tobacco: Not on file  . Alcohol Use: Not on file   No family history on file. Allergies  Allergen Reactions  . Alendronate Sodium     REACTION: hip pain  . Atorvastatin     REACTION: myalgias  . Ezetimibe     REACTION: side effects  . Rosuvastatin     REACTION: myalgias, GI upset  . Simvastatin     REACTION: myalgias   Current Outpatient Prescriptions on File Prior to Visit  Medication Sig Dispense Refill  . atenolol (TENORMIN) 50 MG tablet Take 1 tablet (50 mg total) by mouth daily.  90 tablet  1  . Cholecalciferol (VITAMIN D3) 50000  UNITS CAPS Take 1 capsule by mouth once a week.  6 capsule  0  . Omeprazole-Sodium Bicarbonate (ZEGERID OTC) 20-1100 MG CAPS Take 1 capsule by mouth daily before breakfast.  42 each  3  . quinapril (ACCUPRIL) 10 MG tablet TAKE 1 TABLET BY MOUTH EVERY DAY  90 tablet  1   The PMH, PSH, Social History, Family History, Medications, and allergies have been reviewed in [facility], and have been updated if relevant.   Review of Systems       See HPI Patient reports no  vision/ hearing changes,anorexia, weight change, fever ,adenopathy, persistant / recurrent hoarseness, swallowing issues, chest pain, edema,persistant / recurrent cough, hemoptysis, dyspnea(rest, exertional, paroxysmal nocturnal), gastrointestinal  bleeding (melena, rectal bleeding), abdominal pain, excessive heart burn, GU symptoms(dysuria, hematuria, pyuria, voiding/incontinence  Issues) syncope, focal weakness, severe memory loss, concerning skin lesions, depression, anxiety, abnormal bruising/bleeding, major joint swelling, breast masses or abnormal vaginal bleeding.     Physical Exam BP 120/82  Pulse 76  Temp(Src) 98.7 F (37.1 C) (Oral)  Ht 5\' 5"  (1.651 m)  Wt 128 lb (58.06 kg)  BMI 21.30 kg/m2 Wt Readings from Last 3 Encounters:  11/09/11 128 lb (58.06 kg)  05/11/10 133 lb (60.328  kg)  06/23/09 132 lb 4 oz (59.988 kg)   General:  alert, well-developed, well-nourished, and well-hydrated.   Head:  normocephalic, atraumatic, and no abnormalities observed.   Eyes:  pupils equal, pupils round, and no injection.   Ears:  R ear normal and L ear normal.   Nose:  no external deformity.   Mouth:  good dentition.   Neck:  no masses, no thyromegaly, no JVD, and no carotid bruits.   Breasts:  No mass, nodules, thickening, tenderness, bulging, retraction, inflamation, nipple discharge or skin changes noted.   Lungs:  normal respiratory effort, no intercostal retractions, no accessory muscle use, and normal breath sounds.   Heart:  normal  rate, regular rhythm, and no murmur.   Abdomen:  soft, non-tender, normal bowel sounds, no distention, no masses, no guarding, no abdominal hernia, no inguinal hernia, no hepatomegaly, and no splenomegaly.   Msk:  L lateral knee,soft mass at joint line, feels to be full of fluid, not tender, no eryth full ROM of L knee Extremities:  no edema either lower leg Skin:  turgor normal, color normal, and no rashes.   Psych:  normally interactive and good eye contact.    Assessment and Plan: 1. Routine general medical examination at a health care facility  Reviewed preventive care protocols, scheduled due services, and updated immunizations Discussed nutrition, exercise, diet, and healthy lifestyle.  MM Digital Screening IFOB  2. Hypertriglyceridemia  Deteriorated. Discussed diet again but this is likely hereditary. Needs to restart Trilipix. The patient indicates understanding of these issues and agrees with the plan.    3. HYPERTENSION, BENIGN ESSENTIAL  Stable but I am concerned that her cough is due to ACEI.  Advised stopping the Accupril.  Walking now and has lost weight and may not need another agent. She has BP cuff at home.  Will call in a few weeks with an update of BP, sooner if it rises about 140/90.

## 2011-11-22 ENCOUNTER — Other Ambulatory Visit: Payer: BC Managed Care – PPO

## 2011-11-22 DIAGNOSIS — Z1211 Encounter for screening for malignant neoplasm of colon: Secondary | ICD-10-CM

## 2011-11-22 LAB — FECAL OCCULT BLOOD, IMMUNOCHEMICAL: Fecal Occult Bld: NEGATIVE

## 2011-11-23 ENCOUNTER — Encounter: Payer: Self-pay | Admitting: *Deleted

## 2011-12-19 ENCOUNTER — Telehealth: Payer: Self-pay | Admitting: *Deleted

## 2011-12-19 ENCOUNTER — Other Ambulatory Visit: Payer: Self-pay | Admitting: *Deleted

## 2011-12-19 MED ORDER — ATENOLOL 50 MG PO TABS: 50.0000 mg | ORAL_TABLET | Freq: Every day | ORAL | Status: DC

## 2011-12-19 MED ORDER — QUINAPRIL HCL 10 MG PO TABS: 10.0000 mg | ORAL_TABLET | Freq: Every day | ORAL | Status: DC

## 2011-12-19 NOTE — Telephone Encounter (Signed)
Will route to PCP as fyi. 90d supply meds refilled earlier today.

## 2011-12-19 NOTE — Telephone Encounter (Signed)
Patient called stating that she was suppose to call Dr. Dayton Martes back to let her know if she started back on her BP medications. Patient stated that her Atenolol and Quinapril was stopped at last visit, but she had to start back taking them after being off of them 3 days. Patient stated that her BP was elevated and she had a headache. Patient stated that this was 3 days after her last office visit and she can not remember the BP readings that she got. Patient stated that she needs refills on these two medications sent to [facility]. These two medications were refilled earlier today. See documentation.

## 2012-01-04 ENCOUNTER — Ambulatory Visit: Payer: Self-pay | Admitting: Family Medicine

## 2012-01-05 ENCOUNTER — Encounter: Payer: Self-pay | Admitting: Family Medicine

## 2012-01-11 ENCOUNTER — Encounter: Payer: Self-pay | Admitting: Family Medicine

## 2012-01-11 ENCOUNTER — Encounter: Payer: Self-pay | Admitting: *Deleted

## 2012-01-11 LAB — HM MAMMOGRAPHY: HM Mammogram: NORMAL

## 2012-03-02 ENCOUNTER — Other Ambulatory Visit: Payer: Self-pay | Admitting: Family Medicine

## 2012-06-05 ENCOUNTER — Other Ambulatory Visit: Payer: Self-pay | Admitting: Family Medicine

## 2012-12-10 ENCOUNTER — Other Ambulatory Visit: Payer: Self-pay | Admitting: Family Medicine

## 2012-12-12 ENCOUNTER — Other Ambulatory Visit: Payer: Self-pay | Admitting: Family Medicine

## 2012-12-17 ENCOUNTER — Telehealth: Payer: Self-pay

## 2012-12-17 NOTE — Telephone Encounter (Signed)
Pt request call to [facility]; when pt went to pick up Zegrid OTC was told it would be $500.00. I spoke with Autumn at [facility] and she advised pt could pick up med for $5.00. Pt advised.

## 2012-12-30 ENCOUNTER — Other Ambulatory Visit: Payer: Self-pay | Admitting: Family Medicine

## 2013-01-18 ENCOUNTER — Other Ambulatory Visit: Payer: Self-pay | Admitting: Family Medicine

## 2013-01-22 ENCOUNTER — Ambulatory Visit: Payer: BC Managed Care – PPO | Admitting: Family Medicine

## 2013-01-23 ENCOUNTER — Encounter: Payer: Self-pay | Admitting: Family Medicine

## 2013-01-23 ENCOUNTER — Ambulatory Visit (INDEPENDENT_AMBULATORY_CARE_PROVIDER_SITE_OTHER): Payer: BC Managed Care – PPO | Admitting: Family Medicine

## 2013-01-23 VITALS — BP 140/90 | HR 60 | Temp 97.9°F | Wt 134.0 lb

## 2013-01-23 DIAGNOSIS — I10 Essential (primary) hypertension: Secondary | ICD-10-CM

## 2013-01-23 DIAGNOSIS — E559 Vitamin D deficiency, unspecified: Secondary | ICD-10-CM

## 2013-01-23 DIAGNOSIS — E781 Pure hyperglyceridemia: Secondary | ICD-10-CM

## 2013-01-23 DIAGNOSIS — E786 Lipoprotein deficiency: Secondary | ICD-10-CM

## 2013-01-23 LAB — LIPID PANEL
HDL: 27.5 mg/dL — ABNORMAL LOW (ref >39.00)
Total CHOL/HDL Ratio: 7
Triglycerides: 154 mg/dL — ABNORMAL HIGH (ref 0.0–149.0)
VLDL: 30.8 mg/dL (ref 0.0–40.0)

## 2013-01-23 LAB — COMPREHENSIVE METABOLIC PANEL
AST: 24 U/L (ref 0–37)
BUN: 16 mg/dL (ref 6–23)
Calcium: 9.2 mg/dL (ref 8.4–10.5)
Chloride: 105 mEq/L (ref 96–112)
Creatinine, Ser: 1.1 mg/dL (ref 0.4–1.2)
Total Bilirubin: 0.5 mg/dL (ref 0.3–1.2)

## 2013-01-23 MED ORDER — QUINAPRIL HCL 10 MG PO TABS: ORAL_TABLET | ORAL | Status: DC

## 2013-01-23 MED ORDER — ATENOLOL 50 MG PO TABS: ORAL_TABLET | ORAL | Status: DC

## 2013-01-23 MED ORDER — ZEGERID OTC 20-1100 MG PO CAPS: ORAL_CAPSULE | ORAL | Status: DC

## 2013-01-23 MED ORDER — FENOFIBRIC ACID 135 MG PO CPDR: 1.0000 | DELAYED_RELEASE_CAPSULE | Freq: Every day | ORAL | Status: DC

## 2013-01-23 NOTE — Progress Notes (Signed)
62 yo pleasant female who has not been seen for routine care in over a year here for med refills.    HLD-  Lab Results  Component Value Date   CHOL 193 11/06/2011   HDL 35.10* 11/06/2011   LDLCALC 135* 06/23/2009   LDLDIRECT 76.9 11/06/2011   TRIG 443.0 Triglyceride is over 400; calculations on Lipids are invalid.* 11/06/2011   CHOLHDL 5 11/06/2011    She has been working on her diet and walking daily still. Does have a h/o MI in her 60s.  HTN- On Atenolol 50 daily on accupril 10 mg daily.  No HA, blurred vision, no CP, no SOB.  Patient Active Problem List  Diagnosis  . UNSPECIFIED VITAMIN D DEFICIENCY  . Pure hyperglyceridemia  . LOW HDL  . HYPERTENSION, BENIGN ESSENTIAL  . MYOCARDIAL INFARCTION, HX OF  . ALLERGIC RHINITIS  . G E R D  . OSTEOPENIA  . FIBROCYSTIC BREAST DISEASE, HX OF  . Hypertriglyceridemia   Past Medical History  Diagnosis Date  . Myocardial infarction   . Allergy    Past Surgical History  Procedure Laterality Date  . Endocervical polyp  06/1998    removed  . Breast cyst aspiration      left  . Cholecystectomy    . Ganglion cyst excision  02/2007    left knee   History  Substance Use Topics  . Smoking status: Never Smoker   . Smokeless tobacco: Not on file  . Alcohol Use: Not on file   Family History  Problem Relation Age of Onset  . Cancer Mother     colon  . Diabetes Mother   . Hypertension Mother   . Cancer Father 75    throat  . Fibromyalgia Sister   . Heart disease Brother   . Alcohol abuse Son   . Drug abuse Son   . Cancer Maternal Grandmother     brest  . Anemia Brother    Allergies  Allergen Reactions  . Alendronate Sodium     REACTION: hip pain  . Atorvastatin     REACTION: myalgias  . Ezetimibe     REACTION: side effects  . Rosuvastatin     REACTION: myalgias, GI upset  . Simvastatin     REACTION: myalgias   Current Outpatient Prescriptions on File Prior to Visit  Medication Sig Dispense Refill  . atenolol  (TENORMIN) 50 MG tablet TAKE 1 TABLET BY MOUTH EVERY DAY  90 tablet  0  . Cholecalciferol (VITAMIN D3) 50000 UNITS CAPS Take 1 capsule by mouth once a week.  6 capsule  0  . Choline Fenofibrate (FENOFIBRIC ACID) 135 MG CPDR TAKE ONE CAPSULE BY MOUTH ONCE A DAY  30 capsule  0  . Omeprazole-Sodium Bicarbonate (ZEGERID OTC) 20-1100 MG CAPS Take 1 capsule by mouth daily before breakfast.  90 capsule  3  . quinapril (ACCUPRIL) 10 MG tablet TAKE 1 TABLET BY MOUTH EVERY DAY  90 tablet  0  . ZEGERID OTC 20-1100 MG CAPS TAKE 1 CAPSULE BY MOUTH DAILY BEFORE BREAKFAST.  42 capsule  0   No current facility-administered medications on file prior to visit.   The PMH, PSH, Social History, Family History, Medications, and allergies have been reviewed in [facility], and have been updated if relevant.   Review of Systems       See HPI Patient reports no  vision/ hearing changes,anorexia, weight change, fever ,adenopathy, persistant / recurrent hoarseness, swallowing issues, chest pain, edema,persistant / recurrent cough,  hemoptysis, dyspnea(rest, exertional, paroxysmal nocturnal), gastrointestinal  bleeding (melena, rectal bleeding), abdominal pain, excessive heart burn, GU symptoms(dysuria, hematuria, pyuria, voiding/incontinence  Issues) syncope, focal weakness, severe memory loss, concerning skin lesions, depression, anxiety, abnormal bruising/bleeding, major joint swelling, breast masses or abnormal vaginal bleeding.     Physical Exam BP 140/90  Pulse 60  Temp(Src) 97.9 F (36.6 C)  Wt 134 lb (60.782 kg)  BMI 22.3 kg/m2 Wt Readings from Last 3 Encounters:  01/23/13 134 lb (60.782 kg)  11/09/11 128 lb (58.06 kg)  05/11/10 133 lb (60.328 kg)    General:  alert, well-developed, well-nourished, and well-hydrated.   Head:  normocephalic, atraumatic, and no abnormalities observed.   Eyes:  pupils equal, pupils round, and no injection.   Ears:  R ear normal and L ear normal.   Nose:  no external deformity.    Mouth:  good dentition.   Neck:  no masses, no thyromegaly, no JVD, and no carotid bruits.   Breasts:  No mass, nodules, thickening, tenderness, bulging, retraction, inflamation, nipple discharge or skin changes noted.   Lungs:  normal respiratory effort, no intercostal retractions, no accessory muscle use, and normal breath sounds.   Heart:  normal rate, regular rhythm, and no murmur.   Abdomen:  soft, non-tender, normal bowel sounds, no distention, no masses, no guarding, no abdominal hernia, no inguinal hernia, no hepatomegaly, and no splenomegaly.   Msk:  L lateral knee,soft mass at joint line, feels to be full of fluid, not tender, no eryth full ROM of L knee Extremities:  no edema either lower leg Skin:  turgor normal, color normal, and no rashes.   Psych:  normally interactive and good eye contact.    Assessment and Plan:  1. HYPERTENSION, BENIGN ESSENTIAL Stable on lisinopril and Metformin.  Rx refilled.    2. Hypertriglyceridemia Continue Fenofibrate.  Recheck labs today.  - Comprehensive metabolic panel - Lipid Panel   3. Unspecified vitamin D deficiency  - Vitamin D 25 hydroxy

## 2013-01-23 NOTE — Patient Instructions (Addendum)
Good to see you. We will call you with your lab results.   

## 2013-01-27 MED ORDER — VITAMIN D (ERGOCALCIFEROL) 1.25 MG (50000 UNIT) PO CAPS: ORAL_CAPSULE | ORAL | Status: DC

## 2013-01-27 NOTE — Addendum Note (Signed)
Addended by: Dianne Dun on: 01/27/2013 01:25 PM   Modules accepted: Level of Service

## 2013-01-27 NOTE — Addendum Note (Signed)
Addended by: Eliezer Bottom on: 01/27/2013 04:23 PM   Modules accepted: Orders

## 2013-03-15 ENCOUNTER — Other Ambulatory Visit: Payer: Self-pay | Admitting: Family Medicine

## 2013-05-05 ENCOUNTER — Other Ambulatory Visit: Payer: Self-pay | Admitting: Family Medicine

## 2013-09-04 ENCOUNTER — Other Ambulatory Visit: Payer: Self-pay

## 2013-09-10 ENCOUNTER — Other Ambulatory Visit: Payer: Self-pay | Admitting: Family Medicine

## 2013-10-12 ENCOUNTER — Other Ambulatory Visit: Payer: Self-pay | Admitting: Family Medicine

## 2013-10-12 NOTE — Telephone Encounter (Signed)
Last office visit 01/23/2013.  Ok to refill?

## 2013-11-06 ENCOUNTER — Other Ambulatory Visit: Payer: Self-pay | Admitting: Internal Medicine

## 2013-12-01 ENCOUNTER — Other Ambulatory Visit: Payer: Self-pay | Admitting: Family Medicine

## 2013-12-08 ENCOUNTER — Other Ambulatory Visit: Payer: Self-pay | Admitting: Family Medicine

## 2013-12-08 NOTE — Telephone Encounter (Signed)
Spoke to pt and informed her an ov is required for additional refills. Pt states that she will call back to schedule.

## 2013-12-11 ENCOUNTER — Ambulatory Visit (INDEPENDENT_AMBULATORY_CARE_PROVIDER_SITE_OTHER): Payer: BC Managed Care – PPO | Admitting: Family Medicine

## 2013-12-11 ENCOUNTER — Encounter: Payer: Self-pay | Admitting: Family Medicine

## 2013-12-11 VITALS — BP 136/88 | HR 76 | Temp 97.5°F | Ht 64.0 in | Wt 134.2 lb

## 2013-12-11 DIAGNOSIS — I10 Essential (primary) hypertension: Secondary | ICD-10-CM

## 2013-12-11 DIAGNOSIS — E559 Vitamin D deficiency, unspecified: Secondary | ICD-10-CM

## 2013-12-11 DIAGNOSIS — Z0000 Encounter for general adult medical examination without abnormal findings: Secondary | ICD-10-CM

## 2013-12-11 DIAGNOSIS — E781 Pure hyperglyceridemia: Secondary | ICD-10-CM

## 2013-12-11 LAB — COMPREHENSIVE METABOLIC PANEL
ALBUMIN: 3.9 g/dL (ref 3.5–5.2)
ALT: 23 U/L (ref 0–35)
AST: 20 U/L (ref 0–37)
Alkaline Phosphatase: 59 U/L (ref 39–117)
BUN: 16 mg/dL (ref 6–23)
CALCIUM: 9.5 mg/dL (ref 8.4–10.5)
CHLORIDE: 107 meq/L (ref 96–112)
CO2: 26 mEq/L (ref 19–32)
Creatinine, Ser: 1.2 mg/dL (ref 0.4–1.2)
GFR: 49.98 mL/min — ABNORMAL LOW (ref >60.00)
GLUCOSE: 110 mg/dL — AB (ref 70–99)
POTASSIUM: 4.5 meq/L (ref 3.5–5.1)
Sodium: 141 mEq/L (ref 135–145)
Total Bilirubin: 0.7 mg/dL (ref 0.3–1.2)
Total Protein: 7.6 g/dL (ref 6.0–8.3)

## 2013-12-11 LAB — CBC WITH DIFFERENTIAL/PLATELET
BASOS PCT: 0.5 % (ref 0.0–3.0)
Basophils Absolute: 0 10*3/uL (ref 0.0–0.1)
EOS ABS: 0.3 10*3/uL (ref 0.0–0.7)
EOS PCT: 3.8 % (ref 0.0–5.0)
HCT: 38.6 % (ref 36.0–46.0)
Hemoglobin: 12.8 g/dL (ref 12.0–15.0)
LYMPHS PCT: 33.2 % (ref 12.0–46.0)
Lymphs Abs: 3.1 10*3/uL (ref 0.7–4.0)
MCHC: 33.2 g/dL (ref 30.0–36.0)
MCV: 90.5 fl (ref 78.0–100.0)
Monocytes Absolute: 0.7 10*3/uL (ref 0.1–1.0)
Monocytes Relative: 7.6 % (ref 3.0–12.0)
NEUTROS ABS: 5.1 10*3/uL (ref 1.4–7.7)
Neutrophils Relative %: 54.9 % (ref 43.0–77.0)
Platelets: 443 10*3/uL — ABNORMAL HIGH (ref 150.0–400.0)
RBC: 4.27 Mil/uL (ref 3.87–5.11)
RDW: 13.5 % (ref 11.5–14.6)
WBC: 9.2 10*3/uL (ref 4.5–10.5)

## 2013-12-11 LAB — LIPID PANEL
CHOLESTEROL: 197 mg/dL (ref 0–200)
HDL: 29.7 mg/dL — ABNORMAL LOW (ref >39.00)
LDL Cholesterol: 144 mg/dL — ABNORMAL HIGH (ref 0–99)
Total CHOL/HDL Ratio: 7
Triglycerides: 118 mg/dL (ref 0.0–149.0)
VLDL: 23.6 mg/dL (ref 0.0–40.0)

## 2013-12-11 MED ORDER — QUINAPRIL HCL 10 MG PO TABS: 10.0000 mg | ORAL_TABLET | Freq: Every day | ORAL | Status: DC

## 2013-12-11 MED ORDER — FENOFIBRIC ACID 135 MG PO CPDR: 1.0000 | DELAYED_RELEASE_CAPSULE | Freq: Every day | ORAL | Status: DC

## 2013-12-11 NOTE — Patient Instructions (Signed)
Great to see you. We will call you with your lab results and you can view them online.  

## 2013-12-11 NOTE — Assessment & Plan Note (Signed)
Well controlled. No changes- rx refilled.

## 2013-12-11 NOTE — Progress Notes (Signed)
63 yo pleasant female here for med refills.    HLD-  Lab Results  Component Value Date   CHOL 191 01/23/2013   HDL 27.50* 01/23/2013   LDLCALC 133* 01/23/2013   LDLDIRECT 76.9 11/06/2011   TRIG 154.0* 01/23/2013   CHOLHDL 7 01/23/2013    She has been working on her diet and walking daily still.  Does have a h/o MI in her 57s.  HTN- On Atenolol 50 daily on accupril 10 mg daily.  No HA, blurred vision, no CP, no SOB. Lab Results  Component Value Date   CREATININE 1.1 01/23/2013   Vit D deficiency- due for labs today.  Patient Active Problem List   Diagnosis Date Noted  . Hypertriglyceridemia 11/09/2011  . UNSPECIFIED VITAMIN D DEFICIENCY 04/01/2009  . LOW HDL 04/01/2009  . Pure hyperglyceridemia 03/10/2009  . MYOCARDIAL INFARCTION, HX OF 03/10/2009  . ALLERGIC RHINITIS 03/10/2009  . FIBROCYSTIC BREAST DISEASE, HX OF 03/10/2009  . HYPERTENSION, BENIGN ESSENTIAL 03/02/2009  . G E R D 07/09/2007  . OSTEOPENIA 08/23/2000   Past Medical History  Diagnosis Date  . Myocardial infarction   . Allergy    Past Surgical History  Procedure Laterality Date  . Endocervical polyp  06/1998    removed  . Breast cyst aspiration      left  . Cholecystectomy    . Ganglion cyst excision  02/2007    left knee   History  Substance Use Topics  . Smoking status: Never Smoker   . Smokeless tobacco: Not on file  . Alcohol Use: Not on file   Family History  Problem Relation Age of Onset  . Cancer Mother     colon  . Diabetes Mother   . Hypertension Mother   . Cancer Father 75    throat  . Fibromyalgia Sister   . Heart disease Brother   . Alcohol abuse Son   . Drug abuse Son   . Cancer Maternal Grandmother     brest  . Anemia Brother    Allergies  Allergen Reactions  . Alendronate Sodium     REACTION: hip pain  . Atorvastatin     REACTION: myalgias  . Ezetimibe     REACTION: side effects  . Rosuvastatin     REACTION: myalgias, GI upset  . Simvastatin     REACTION:  myalgias   Current Outpatient Prescriptions on File Prior to Visit  Medication Sig Dispense Refill  . atenolol (TENORMIN) 50 MG tablet TAKE 1 TABLET BY MOUTH ONCE A DAY  90 tablet  1  . Omeprazole-Sodium Bicarbonate (ZEGERID OTC) 20-1100 MG CAPS capsule TAKE 1 CAPSULE BY MOUTH DAILY BEFORE BREAKFAST.  30 capsule  5  . Vitamin D, Ergocalciferol, (DRISDOL) 50000 UNITS CAPS Take one capsule by mouth weekly x 6 weeks.  6 capsule  0   No current facility-administered medications on file prior to visit.   The PMH, PSH, Social History, Family History, Medications, and allergies have been reviewed in [facility], and have been updated if relevant.   Review of Systems       See HPI Patient reports no  vision/ hearing changes,anorexia, weight change, fever ,adenopathy, persistant / recurrent hoarseness, swallowing issues, chest pain, edema,persistant / recurrent cough, hemoptysis, dyspnea(rest, exertional, paroxysmal nocturnal), gastrointestinal  bleeding (melena, rectal bleeding), abdominal pain, excessive heart burn, GU symptoms(dysuria, hematuria, pyuria, voiding/incontinence  Issues) syncope, focal weakness, severe memory loss, concerning skin lesions, depression, anxiety, abnormal bruising/bleeding, major joint swelling, breast masses or abnormal vaginal  bleeding.     Physical Exam BP 136/88  Pulse 76  Temp(Src) 97.5 F (36.4 C) (Oral)  Ht 5\' 4"  (1.626 m)  Wt 134 lb 4 oz (60.895 kg)  BMI 23.03 kg/m2  SpO2 95%  Wt Readings from Last 3 Encounters:  01/23/13 134 lb (60.782 kg)  11/09/11 128 lb (58.06 kg)  05/11/10 133 lb (60.328 kg)    General:  alert, well-developed, well-nourished, and well-hydrated.   Head:  normocephalic, atraumatic, and no abnormalities observed.   Eyes:  pupils equal, pupils round, and no injection.   Lungs:  normal respiratory effort, no intercostal retractions, no accessory muscle use, and normal breath sounds.   Heart:  normal rate, regular rhythm, and no murmur.    Abdomen:  soft, non-tender, normal bowel sounds, no distention, no masses, no guarding, no abdominal hernia, no inguinal hernia, no hepatomegaly, and no splenomegaly.   Msk:  L lateral knee,soft mass at joint line, feels to be full of fluid, not tender, no eryth full ROM of L knee Extremities:  no edema either lower leg Skin:  turgor normal, color normal, and no rashes.   Psych:  normally interactive and good eye contact.    Assessment and Plan:

## 2013-12-11 NOTE — Assessment & Plan Note (Signed)
Rx refilled. Recheck labs today. Orders Placed This Encounter  Procedures  . Comprehensive metabolic panel  . Vitamin D, 25-hydroxy  . Lipid Panel  . CBC with Differential

## 2013-12-11 NOTE — Assessment & Plan Note (Signed)
Recheck Vit D today. 

## 2013-12-11 NOTE — Progress Notes (Signed)
Pre-visit discussion using our clinic review tool. No additional management support is needed unless otherwise documented below in the visit note.  

## 2013-12-12 ENCOUNTER — Telehealth: Payer: Self-pay | Admitting: Family Medicine

## 2013-12-12 LAB — VITAMIN D 25 HYDROXY (VIT D DEFICIENCY, FRACTURES): VIT D 25 HYDROXY: 25 ng/mL — AB (ref 30–89)

## 2013-12-12 NOTE — Telephone Encounter (Signed)
Relevant patient education assigned to patient using Emmi. ° °

## 2013-12-22 ENCOUNTER — Other Ambulatory Visit: Payer: Self-pay | Admitting: *Deleted

## 2013-12-22 DIAGNOSIS — E550 Rickets, active: Secondary | ICD-10-CM

## 2013-12-22 MED ORDER — VITAMIN D (ERGOCALCIFEROL) 1.25 MG (50000 UNIT) PO CAPS: ORAL_CAPSULE | ORAL | Status: DC

## 2014-01-12 ENCOUNTER — Other Ambulatory Visit: Payer: Self-pay | Admitting: Family Medicine

## 2014-02-05 ENCOUNTER — Other Ambulatory Visit: Payer: Self-pay | Admitting: *Deleted

## 2014-02-05 MED ORDER — QUINAPRIL HCL 10 MG PO TABS: 10.0000 mg | ORAL_TABLET | Freq: Every day | ORAL | Status: DC

## 2014-03-10 ENCOUNTER — Other Ambulatory Visit: Payer: Self-pay | Admitting: Family Medicine

## 2014-07-05 ENCOUNTER — Other Ambulatory Visit: Payer: Self-pay | Admitting: Family Medicine

## 2014-07-07 NOTE — Telephone Encounter (Signed)
[facility] ref[facility]; done with note pt needs to schedule appt.

## 2014-07-09 ENCOUNTER — Other Ambulatory Visit: Payer: Self-pay | Admitting: Family Medicine

## 2014-08-07 ENCOUNTER — Other Ambulatory Visit: Payer: Self-pay | Admitting: *Deleted

## 2014-08-07 MED ORDER — OMEPRAZOLE-SODIUM BICARBONATE 20-1100 MG PO CAPS: ORAL_CAPSULE | ORAL | Status: DC

## 2014-09-02 ENCOUNTER — Other Ambulatory Visit: Payer: Self-pay | Admitting: *Deleted

## 2014-09-02 MED ORDER — QUINAPRIL HCL 10 MG PO TABS: 10.0000 mg | ORAL_TABLET | Freq: Every day | ORAL | Status: DC

## 2014-09-16 ENCOUNTER — Encounter: Payer: Self-pay | Admitting: Family Medicine

## 2014-09-16 ENCOUNTER — Ambulatory Visit (INDEPENDENT_AMBULATORY_CARE_PROVIDER_SITE_OTHER): Payer: BC Managed Care – PPO | Admitting: Family Medicine

## 2014-09-16 VITALS — BP 138/86 | HR 83 | Temp 98.1°F | Wt 132.0 lb

## 2014-09-16 DIAGNOSIS — E781 Pure hyperglyceridemia: Secondary | ICD-10-CM

## 2014-09-16 DIAGNOSIS — I10 Essential (primary) hypertension: Secondary | ICD-10-CM

## 2014-09-16 DIAGNOSIS — I252 Old myocardial infarction: Secondary | ICD-10-CM

## 2014-09-16 NOTE — Assessment & Plan Note (Signed)
Continue current rx.  No changes made today. 

## 2014-09-16 NOTE — Progress Notes (Signed)
Pre visit review using our clinic review tool, if applicable. No additional management support is needed unless otherwise documented below in the visit note. 

## 2014-09-16 NOTE — Progress Notes (Signed)
63 yo pleasant female here for med refills.    HLD-  Lab Results  Component Value Date   CHOL 197 12/11/2013   HDL 29.70* 12/11/2013   LDLCALC 144* 12/11/2013   LDLDIRECT 76.9 11/06/2011   TRIG 118.0 12/11/2013   CHOLHDL 7 12/11/2013    She has been working on her diet and walking daily still.  Does have a h/o MI in her 20s.  HTN- On Atenolol 50 daily on accupril 10 mg daily.  No HA, blurred vision, no CP, no SOB. Lab Results  Component Value Date   CREATININE 1.2 12/11/2013     Patient Active Problem List   Diagnosis Date Noted  . Hypertriglyceridemia 11/09/2011  . UNSPECIFIED VITAMIN D DEFICIENCY 04/01/2009  . LOW HDL 04/01/2009  . Pure hyperglyceridemia 03/10/2009  . MYOCARDIAL INFARCTION, HX OF 03/10/2009  . ALLERGIC RHINITIS 03/10/2009  . FIBROCYSTIC BREAST DISEASE, HX OF 03/10/2009  . HYPERTENSION, BENIGN ESSENTIAL 03/02/2009  . G E R D 07/09/2007  . OSTEOPENIA 08/23/2000   Past Medical History  Diagnosis Date  . Myocardial infarction   . Allergy    Past Surgical History  Procedure Laterality Date  . Endocervical polyp  06/1998    removed  . Breast cyst aspiration      left  . Cholecystectomy    . Ganglion cyst excision  02/2007    left knee   History  Substance Use Topics  . Smoking status: Never Smoker   . Smokeless tobacco: Not on file  . Alcohol Use: Not on file   Family History  Problem Relation Age of Onset  . Cancer Mother     colon  . Diabetes Mother   . Hypertension Mother   . Cancer Father 75    throat  . Fibromyalgia Sister   . Heart disease Brother   . Alcohol abuse Son   . Drug abuse Son   . Cancer Maternal Grandmother     brest  . Anemia Brother    Allergies  Allergen Reactions  . Alendronate Sodium     REACTION: hip pain  . Atorvastatin     REACTION: myalgias  . Ezetimibe     REACTION: side effects  . Rosuvastatin     REACTION: myalgias, GI upset  . Simvastatin     REACTION: myalgias   Current Outpatient  Prescriptions on File Prior to Visit  Medication Sig Dispense Refill  . atenolol (TENORMIN) 50 MG tablet TAKE 1 TABLET BY MOUTH ONCE A DAY 90 tablet 0  . Choline Fenofibrate (FENOFIBRIC ACID) 135 MG CPDR Take 1 capsule by mouth daily. 90 capsule 3  . Omeprazole-Sodium Bicarbonate (ZEGERID OTC) 20-1100 MG CAPS capsule TAKE 1 CAPSULE BY MOUTH DAILY BEFORE BREAKFAST. 30 capsule 3  . quinapril (ACCUPRIL) 10 MG tablet Take 1 tablet (10 mg total) by mouth daily. 30 tablet 0  . Vitamin D, Ergocalciferol, (DRISDOL) 50000 UNITS CAPS capsule Take one capsule by mouth weekly x 6 weeks. 6 capsule 0   No current facility-administered medications on file prior to visit.   The PMH, PSH, Social History, Family History, Medications, and allergies have been reviewed in [facility], and have been updated if relevant.   Review of Systems       See HPI    Physical Exam BP 138/86 mmHg  Pulse 83  Temp(Src) 98.1 F (36.7 C) (Oral)  Wt 132 lb (59.875 kg)  SpO2 97%  Wt Readings from Last 3 Encounters:  09/16/14 132 lb (59.875 kg)  12/11/13 134 lb 4 oz (60.895 kg)  01/23/13 134 lb (60.782 kg)    General:  alert, well-developed, well-nourished, and well-hydrated.   Head:  normocephalic, atraumatic, and no abnormalities observed.   Eyes:  pupils equal, pupils round, and no injection.   Lungs:  normal respiratory effort, no intercostal retractions, no accessory muscle use, and normal breath sounds.   Heart:  normal rate, regular rhythm, and no murmur.   Abdomen:  soft, non-tender, normal bowel sounds, no distention, no masses, no guarding, no abdominal hernia, no inguinal hernia, no hepatomegaly, and no splenomegaly.   Msk:  L lateral knee,soft mass at joint line, feels to be full of fluid, not tender, no eryth full ROM of L knee Extremities:  no edema either lower leg Skin:  turgor normal, color normal, and no rashes.   Psych:  normally interactive and good eye contact.

## 2014-09-16 NOTE — Assessment & Plan Note (Signed)
Well controlled.  No changes made. 

## 2014-10-07 ENCOUNTER — Other Ambulatory Visit: Payer: Self-pay | Admitting: *Deleted

## 2014-10-07 MED ORDER — QUINAPRIL HCL 10 MG PO TABS: 10.0000 mg | ORAL_TABLET | Freq: Every day | ORAL | Status: DC

## 2014-12-31 ENCOUNTER — Other Ambulatory Visit: Payer: Self-pay | Admitting: Family Medicine

## 2015-01-04 ENCOUNTER — Other Ambulatory Visit: Payer: Self-pay | Admitting: Family Medicine

## 2015-01-24 ENCOUNTER — Other Ambulatory Visit: Payer: Self-pay | Admitting: Family Medicine

## 2015-03-02 ENCOUNTER — Other Ambulatory Visit: Payer: Self-pay | Admitting: Family Medicine

## 2015-03-28 ENCOUNTER — Other Ambulatory Visit: Payer: Self-pay | Admitting: Family Medicine

## 2015-03-30 ENCOUNTER — Other Ambulatory Visit: Payer: Self-pay

## 2015-03-30 MED ORDER — FENOFIBRIC ACID 135 MG PO CPDR: 1.0000 | DELAYED_RELEASE_CAPSULE | Freq: Every day | ORAL | Status: DC

## 2015-03-30 NOTE — Telephone Encounter (Signed)
Pt left v/m requesting refill fenofibric 135 mg to [facility]. Pt last seen 09/16/14 and last lipid lab 12/11/13. No future appt scheduled.Please advise. rx last refilled 03/03/15 # 30.

## 2015-03-30 NOTE — Telephone Encounter (Signed)
This pts medication was denied on 05/29 because the pt has not had labs since 11/2013. Approved per Dr Deborra Medina

## 2015-04-12 ENCOUNTER — Encounter: Payer: Self-pay | Admitting: Family Medicine

## 2015-04-12 ENCOUNTER — Other Ambulatory Visit: Payer: Self-pay | Admitting: Family Medicine

## 2015-04-12 ENCOUNTER — Ambulatory Visit (INDEPENDENT_AMBULATORY_CARE_PROVIDER_SITE_OTHER): Payer: Medicare Other | Admitting: Family Medicine

## 2015-04-12 VITALS — BP 138/82 | HR 73 | Temp 97.9°F | Wt 137.2 lb

## 2015-04-12 DIAGNOSIS — Z1159 Encounter for screening for other viral diseases: Secondary | ICD-10-CM | POA: Diagnosis not present

## 2015-04-12 DIAGNOSIS — E559 Vitamin D deficiency, unspecified: Secondary | ICD-10-CM

## 2015-04-12 DIAGNOSIS — E781 Pure hyperglyceridemia: Secondary | ICD-10-CM

## 2015-04-12 DIAGNOSIS — I10 Essential (primary) hypertension: Secondary | ICD-10-CM | POA: Diagnosis not present

## 2015-04-12 DIAGNOSIS — I252 Old myocardial infarction: Secondary | ICD-10-CM

## 2015-04-12 LAB — LIPID PANEL
CHOLESTEROL: 176 mg/dL (ref 0–200)
HDL: 29.1 mg/dL — AB (ref >39.00)
LDL Cholesterol: 113 mg/dL — ABNORMAL HIGH (ref 0–99)
NonHDL: 146.9
TRIGLYCERIDES: 172 mg/dL — AB (ref 0.0–149.0)
Total CHOL/HDL Ratio: 6
VLDL: 34.4 mg/dL (ref 0.0–40.0)

## 2015-04-12 LAB — COMPREHENSIVE METABOLIC PANEL
ALBUMIN: 4.2 g/dL (ref 3.5–5.2)
ALT: 27 U/L (ref 0–35)
AST: 21 U/L (ref 0–37)
Alkaline Phosphatase: 64 U/L (ref 39–117)
BUN: 14 mg/dL (ref 6–23)
CALCIUM: 9.7 mg/dL (ref 8.4–10.5)
CO2: 26 meq/L (ref 19–32)
Chloride: 104 mEq/L (ref 96–112)
Creatinine, Ser: 0.99 mg/dL (ref 0.40–1.20)
GFR: 59.76 mL/min — ABNORMAL LOW (ref >60.00)
Glucose, Bld: 124 mg/dL — ABNORMAL HIGH (ref 70–99)
Potassium: 4.4 mEq/L (ref 3.5–5.1)
SODIUM: 138 meq/L (ref 135–145)
Total Bilirubin: 0.5 mg/dL (ref 0.2–1.2)
Total Protein: 7.5 g/dL (ref 6.0–8.3)

## 2015-04-12 LAB — VITAMIN D 25 HYDROXY (VIT D DEFICIENCY, FRACTURES): VITD: 14.2 ng/mL — ABNORMAL LOW (ref 30.00–100.00)

## 2015-04-12 NOTE — Assessment & Plan Note (Signed)
Continue Vit D. Recheck labs today.

## 2015-04-12 NOTE — Assessment & Plan Note (Signed)
Well controlled. No changes in rxs today.

## 2015-04-12 NOTE — Assessment & Plan Note (Signed)
Continue current rxs. Check labs today. Orders Placed This Encounter  Procedures  . Lipid panel  . Comprehensive metabolic panel  . Vitamin D, 25-hydroxy  . Hepatitis C Ab Reflex HCV RNA, QUANT

## 2015-04-12 NOTE — Progress Notes (Signed)
64 yo pleasant female here for med refills.    HLD-  Taking fenofibrate 135 mg daily.  Intolerant to statins. Lab Results  Component Value Date   CHOL 197 12/11/2013   HDL 29.70* 12/11/2013   LDLCALC 144* 12/11/2013   LDLDIRECT 76.9 11/06/2011   TRIG 118.0 12/11/2013   CHOLHDL 7 12/11/2013   Lab Results  Component Value Date   ALT 23 12/11/2013   AST 20 12/11/2013   ALKPHOS 59 12/11/2013   BILITOT 0.7 12/11/2013     Does have a h/o MI in her 5s.  HTN- On Atenolol 50 daily on accupril 10 mg daily.  No HA, blurred vision, no CP, no SOB. Lab Results  Component Value Date   CREATININE 1.2 12/11/2013   Vit D- taking high dose vitamin D.  Due for labs.  Vit D 1 year ago was 3.  Sister recently treated for hepatitis C.  Ms. Chirico would like to be screened as well.  Patient Active Problem List   Diagnosis Date Noted  . Hypertriglyceridemia 11/09/2011  . Vitamin D deficiency 04/01/2009  . LOW HDL 04/01/2009  . Pure hyperglyceridemia 03/10/2009  . MYOCARDIAL INFARCTION, HX OF 03/10/2009  . ALLERGIC RHINITIS 03/10/2009  . FIBROCYSTIC BREAST DISEASE, HX OF 03/10/2009  . HYPERTENSION, BENIGN ESSENTIAL 03/02/2009  . G E R D 07/09/2007  . OSTEOPENIA 08/23/2000   Past Medical History  Diagnosis Date  . Myocardial infarction   . Allergy    Past Surgical History  Procedure Laterality Date  . Endocervical polyp  06/1998    removed  . Breast cyst aspiration      left  . Cholecystectomy    . Ganglion cyst excision  02/2007    left knee   History  Substance Use Topics  . Smoking status: Never Smoker   . Smokeless tobacco: Not on file  . Alcohol Use: Not on file   Family History  Problem Relation Age of Onset  . Cancer Mother     colon  . Diabetes Mother   . Hypertension Mother   . Cancer Father 75    throat  . Fibromyalgia Sister   . Heart disease Brother   . Alcohol abuse Son   . Drug abuse Son   . Cancer Maternal Grandmother     brest  . Anemia Brother     Allergies  Allergen Reactions  . Alendronate Sodium     REACTION: hip pain  . Atorvastatin     REACTION: myalgias  . Ezetimibe     REACTION: side effects  . Rosuvastatin     REACTION: myalgias, GI upset  . Simvastatin     REACTION: myalgias   Current Outpatient Prescriptions on File Prior to Visit  Medication Sig Dispense Refill  . atenolol (TENORMIN) 50 MG tablet TAKE 1 TABLET BY MOUTH ONCE A DAY 90 tablet 1  . Choline Fenofibrate (FENOFIBRIC ACID) 135 MG CPDR Take 1 capsule by mouth daily. 30 capsule 0  . Omeprazole-Sodium Bicarbonate (ZEGERID OTC) 20-1100 MG CAPS capsule TAKE 1 CAPSULE BY MOUTH DAILY BEFORE BREAKFAST. 30 capsule 3  . quinapril (ACCUPRIL) 10 MG tablet TAKE 1 TABLET BY MOUTH DAILY 30 tablet 6  . Vitamin D, Ergocalciferol, (DRISDOL) 50000 UNITS CAPS capsule Take one capsule by mouth weekly x 6 weeks. 6 capsule 0   No current facility-administered medications on file prior to visit.   The PMH, PSH, Social History, Family History, Medications, and allergies have been reviewed in [facility], and have been  updated if relevant.   Review of Systems  Review of Systems  Constitutional: Negative.   HENT: Negative.   Eyes: Negative.   Cardiovascular: Negative.   Gastrointestinal: Negative.   Endocrine: Negative.   Genitourinary: Negative.   Musculoskeletal: Negative.   Skin: Negative.   Allergic/Immunologic: Negative.   Neurological: Negative.   Hematological: Negative.   Psychiatric/Behavioral: Negative.   All other systems reviewed and are negative.     Physical Exam BP 138/82 mmHg  Pulse 73  Temp(Src) 97.9 F (36.6 C) (Oral)  Wt 137 lb 4 oz (62.256 kg)  SpO2 95%  Wt Readings from Last 3 Encounters:  04/12/15 137 lb 4 oz (62.256 kg)  09/16/14 132 lb (59.875 kg)  12/11/13 134 lb 4 oz (60.895 kg)    Physical Exam  Constitutional: She is oriented to person, place, and time and well-developed, well-nourished, and in no distress. No distress.  HENT:   Head: Normocephalic.  Eyes: Conjunctivae are normal.  Neck: Normal range of motion.  Cardiovascular: Normal rate and regular rhythm.   Pulmonary/Chest: Effort normal and breath sounds normal. No respiratory distress. She has no wheezes.  Abdominal: Soft.  Musculoskeletal: She exhibits no edema or tenderness.  Neurological: She is alert and oriented to person, place, and time.  Skin: Skin is warm and dry. She is not diaphoretic.  Psychiatric: Mood, memory, affect and judgment normal.  Nursing note and vitals reviewed.

## 2015-04-13 LAB — HEPATITIS C ANTIBODY: HCV AB: NEGATIVE

## 2015-04-14 ENCOUNTER — Other Ambulatory Visit: Payer: Self-pay | Admitting: Family Medicine

## 2015-04-14 DIAGNOSIS — R739 Hyperglycemia, unspecified: Secondary | ICD-10-CM

## 2015-04-14 MED ORDER — VITAMIN D (ERGOCALCIFEROL) 1.25 MG (50000 UNIT) PO CAPS: ORAL_CAPSULE | ORAL | Status: DC

## 2015-04-14 NOTE — Addendum Note (Signed)
Addended by: Modena Nunnery on: 04/14/2015 09:49 AM   Modules accepted: Orders

## 2015-04-24 ENCOUNTER — Other Ambulatory Visit: Payer: Self-pay | Admitting: Family Medicine

## 2015-04-26 ENCOUNTER — Other Ambulatory Visit: Payer: Self-pay

## 2015-05-20 ENCOUNTER — Telehealth: Payer: Self-pay

## 2015-05-20 NOTE — Telephone Encounter (Signed)
Called patient to notify her of being due for a Mammogram. Patient stated that she would like to schedule her appointment with [facility]. I gave the patient their contact information, and patient stated that she would call and have that scheduled.

## 2015-07-06 ENCOUNTER — Other Ambulatory Visit: Payer: Self-pay | Admitting: Family Medicine

## 2015-07-12 ENCOUNTER — Encounter: Payer: Self-pay | Admitting: Primary Care

## 2015-07-12 ENCOUNTER — Ambulatory Visit (INDEPENDENT_AMBULATORY_CARE_PROVIDER_SITE_OTHER): Payer: Medicare Other | Admitting: Primary Care

## 2015-07-12 VITALS — BP 128/80 | HR 78 | Temp 98.0°F | Wt 135.8 lb

## 2015-07-12 DIAGNOSIS — E781 Pure hyperglyceridemia: Secondary | ICD-10-CM | POA: Diagnosis not present

## 2015-07-12 DIAGNOSIS — E785 Hyperlipidemia, unspecified: Secondary | ICD-10-CM | POA: Diagnosis not present

## 2015-07-12 DIAGNOSIS — K219 Gastro-esophageal reflux disease without esophagitis: Secondary | ICD-10-CM | POA: Diagnosis not present

## 2015-07-12 DIAGNOSIS — R197 Diarrhea, unspecified: Secondary | ICD-10-CM | POA: Diagnosis not present

## 2015-07-12 MED ORDER — CHOLESTYRAMINE 4 G PO PACK: 4.0000 g | PACK | Freq: Every day | ORAL | Status: DC

## 2015-07-12 NOTE — Assessment & Plan Note (Signed)
Discussed side effects of Zegrid per up to date including risks of long term management with PPI's. Discussed trigger foods for reflux and remedies at home to help reduce reflux. She will continue to thinking about this and continue Zegrid.

## 2015-07-12 NOTE — Progress Notes (Signed)
Pre visit review using our clinic review tool, if applicable. No additional management support is needed unless otherwise documented below in the visit note. 

## 2015-07-12 NOTE — Patient Instructions (Addendum)
Stop taking our Fenofibrate. Start Cholestyramine packets. Mix one packet daily for cholesterol and diarrhea.  Follow up in 6 weeks for re-evaluation of diarrhea and cholesterol.   It was a pleasure meeting you!

## 2015-07-12 NOTE — Progress Notes (Signed)
Subjective:    Patient ID: Dominique James, female    DOB: 10/10/1951, 64 y.o.   MRN: [id number]  HPI  Dominique James is a 64 year old female who presents today to discuss medications.   1) GERD: She is currently monitored on Zegrid for acid reflux. She sleeps on three pillows for reflux. She is worried about developing renal cancer as she as recently read this on the internet. She has been on this medication for numerous years which has done well to decrease her reflux symptoms.   2) Diarrhea: Gall bladder was removed 15 years ago. Since removal she has immediate lose stools after eating a full meal for which she will have to rush immediately to the nearest restroom. She has historically had to consume small meals at a time due to immediate diarrhea with full meals. She is currently managed on Fenofibrate for hyperlipidemia and is interested in trying cholestyramine to help decrease the diarrhea. Denies increase or changes in diarrhea incidences.    Review of Systems  Constitutional: Negative for fever and chills.  Respiratory: Negative for shortness of breath.   Cardiovascular: Negative for chest pain.  Gastrointestinal: Negative for nausea, abdominal pain, constipation and blood in stool.       See HPI       Past Medical History  Diagnosis Date  . Myocardial infarction   . Allergy     Social History   Social History  . Marital Status: Married    Spouse Name: N/A  . Number of Children: 2  . Years of Education: N/A   Occupational History  . Retired from teaching    Social History Main Topics  . Smoking status: Never Smoker   . Smokeless tobacco: Not on file  . Alcohol Use: Not on file  . Drug Use: Not on file  . Sexual Activity: Not on file   Other Topics Concern  . Not on file   Social History Narrative    Past Surgical History  Procedure Laterality Date  . Endocervical polyp  06/1998    removed  . Breast cyst aspiration      left  . Cholecystectomy    .  Ganglion cyst excision  02/2007    left knee    Family History  Problem Relation Age of Onset  . Cancer Mother     colon  . Diabetes Mother   . Hypertension Mother   . Cancer Father 75    throat  . Fibromyalgia Sister   . Heart disease Brother   . Alcohol abuse Son   . Drug abuse Son   . Cancer Maternal Grandmother     brest  . Anemia Brother     Allergies  Allergen Reactions  . Alendronate Sodium     REACTION: hip pain  . Atorvastatin     REACTION: myalgias  . Ezetimibe     REACTION: side effects  . Rosuvastatin     REACTION: myalgias, GI upset  . Simvastatin     REACTION: myalgias    Current Outpatient Prescriptions on File Prior to Visit  Medication Sig Dispense Refill  . atenolol (TENORMIN) 50 MG tablet TAKE 1 TABLET BY MOUTH ONCE A DAY 90 tablet 1  . Choline Fenofibrate (FENOFIBRIC ACID) 135 MG CPDR TAKE 1 CAPSULE BY MOUTH DAILY. OFFICE VISIT AND LABS REQ FOR REFILLS 30 capsule 5  . Omeprazole-Sodium Bicarbonate (ZEGERID OTC) 20-1100 MG CAPS capsule TAKE 1 CAPSULE BY MOUTH DAILY BEFORE BREAKFAST. 30 capsule  3  . quinapril (ACCUPRIL) 10 MG tablet TAKE 1 TABLET BY MOUTH DAILY 30 tablet 6  . Vitamin D, Ergocalciferol, (DRISDOL) 50000 UNITS CAPS capsule Take one capsule by mouth weekly x 6 weeks. 6 capsule 0   No current facility-administered medications on file prior to visit.    BP 128/80 mmHg  Pulse 78  Temp(Src) 98 F (36.7 C) (Oral)  Wt 135 lb 12.8 oz (61.598 kg)  SpO2 98%    Objective:   Physical Exam  Constitutional: She appears well-nourished.  Cardiovascular: Normal rate and regular rhythm.   Pulmonary/Chest: Effort normal and breath sounds normal.  Abdominal: Soft. Bowel sounds are normal. There is no tenderness.  Skin: Skin is warm and dry.          Assessment & Plan:  Diarrhea:  Present after full meal since cholecystomy 15 years ago. History of hyperlipidemia, she is eager to try cholestyramine to help curb diarrhea and aid with  cholesterol. LFT's WNL. Stop Fenofibrate, start cholestyramine.  Follow up with PCP or myself in 6 weeks for re-evaluation.

## 2015-07-12 NOTE — Assessment & Plan Note (Addendum)
History of hyperlipidemia, also with chronic diarrhea after meals since removal of gall bladder 15 years ago. She is eager to try cholestyramine to help curb diarrhea and aid with cholesterol. LFT's WNL. Stop Fenofibrate, start cholestyramine.  Follow up with PCP or myself in 6 weeks for re-evaluation.

## 2015-08-04 ENCOUNTER — Other Ambulatory Visit: Payer: Self-pay | Admitting: Family Medicine

## 2015-11-05 ENCOUNTER — Other Ambulatory Visit: Payer: Self-pay | Admitting: Primary Care

## 2015-11-05 ENCOUNTER — Ambulatory Visit (INDEPENDENT_AMBULATORY_CARE_PROVIDER_SITE_OTHER): Payer: Medicare Other | Admitting: Primary Care

## 2015-11-05 VITALS — BP 144/94 | HR 86 | Temp 98.2°F | Wt 133.1 lb

## 2015-11-05 DIAGNOSIS — E785 Hyperlipidemia, unspecified: Secondary | ICD-10-CM

## 2015-11-05 DIAGNOSIS — E781 Pure hyperglyceridemia: Secondary | ICD-10-CM

## 2015-11-05 LAB — HEPATIC FUNCTION PANEL
ALBUMIN: 3.9 g/dL (ref 3.5–5.2)
ALK PHOS: 139 U/L — AB (ref 39–117)
ALT: 25 U/L (ref 0–35)
AST: 18 U/L (ref 0–37)
BILIRUBIN DIRECT: 0 mg/dL (ref 0.0–0.3)
TOTAL PROTEIN: 7.3 g/dL (ref 6.0–8.3)
Total Bilirubin: 0.4 mg/dL (ref 0.2–1.2)

## 2015-11-05 LAB — LIPID PANEL
CHOL/HDL RATIO: 6
Cholesterol: 169 mg/dL (ref 0–200)
HDL: 29.6 mg/dL — AB (ref >39.00)

## 2015-11-05 LAB — LDL CHOLESTEROL, DIRECT: Direct LDL: 36 mg/dL

## 2015-11-05 MED ORDER — CHOLINE FENOFIBRATE 135 MG PO CPDR: 135.0000 mg | DELAYED_RELEASE_CAPSULE | Freq: Every day | ORAL | Status: DC

## 2015-11-05 NOTE — Patient Instructions (Signed)
Continue Cholestyramine packets for cholesterol and diarrhea.  Complete lab work prior to leaving today. I will notify you of your results once received.   Follow up with PCP as needed.  It was a pleasure to see you today!

## 2015-11-05 NOTE — Progress Notes (Signed)
Subjective:    Patient ID: Dominique James, female    DOB: 1951/09/24, 65 y.o.   MRN: [id number]  HPI  Dominique James is a 65 year old female who presents today for follow up. She was evaluated in mid September for persistent diarrhea since removal of her gallbladder 15 years ago. Her diarrhea was immediate after full meals and was affecting her social life. She was initiated on Cholestyramine last visit to help with diarrhea and to also treat hyperlipidemia, fenofibrate was discontinued.  Since her last visit she's noticed significant improvement. Her diarrhea has dissipated. Her bowel movements are solid and daily. Denies abdominal pain, constipation. She is due today for lipid panel and monitoring of liver function.  Review of Systems  Constitutional: Negative for fatigue.  Gastrointestinal: Negative for abdominal pain, diarrhea, constipation and blood in stool.  Neurological: Negative for dizziness.       Past Medical History  Diagnosis Date  . Myocardial infarction ([place])   . Allergy     Social History   Social History  . Marital Status: Married    Spouse Name: N/A  . Number of Children: 2  . Years of Education: N/A   Occupational History  . Retired from teaching    Social History Main Topics  . Smoking status: Never Smoker   . Smokeless tobacco: Not on file  . Alcohol Use: Not on file  . Drug Use: Not on file  . Sexual Activity: Not on file   Other Topics Concern  . Not on file   Social History Narrative    Past Surgical History  Procedure Laterality Date  . Endocervical polyp  06/1998    removed  . Breast cyst aspiration      left  . Cholecystectomy    . Ganglion cyst excision  02/2007    left knee    Family History  Problem Relation Age of Onset  . Cancer Mother     colon  . Diabetes Mother   . Hypertension Mother   . Cancer Father 75    throat  . Fibromyalgia Sister   . Heart disease Brother   . Alcohol abuse Son   . Drug abuse Son   .  Cancer Maternal Grandmother     brest  . Anemia Brother     Allergies  Allergen Reactions  . Alendronate Sodium     REACTION: hip pain  . Atorvastatin     REACTION: myalgias  . Ezetimibe     REACTION: side effects  . Penicillins Itching  . Rosuvastatin     REACTION: myalgias, GI upset  . Simvastatin     REACTION: myalgias    Current Outpatient Prescriptions on File Prior to Visit  Medication Sig Dispense Refill  . atenolol (TENORMIN) 50 MG tablet TAKE 1 TABLET BY MOUTH ONCE A DAY 90 tablet 1  . cholestyramine (QUESTRAN) 4 G packet Take 1 packet (4 g total) by mouth daily. 30 each 5  . Omeprazole-Sodium Bicarbonate (ZEGERID OTC) 20-1100 MG CAPS capsule TAKE 1 CAPSULE BY MOUTH DAILY BEFORE BREAKFAST. 30 capsule 3  . quinapril (ACCUPRIL) 10 MG tablet TAKE 1 TABLET BY MOUTH DAILY 30 tablet 6  . Vitamin D, Ergocalciferol, (DRISDOL) 50000 UNITS CAPS capsule Take one capsule by mouth weekly x 6 weeks. 6 capsule 0   No current facility-administered medications on file prior to visit.    BP 144/94 mmHg  Pulse 86  Temp(Src) 98.2 F (36.8 C) (Oral)  Wt 133 lb  1.9 oz (60.383 kg)  SpO2 98%    Objective:   Physical Exam  Constitutional: She appears well-nourished.  Cardiovascular: Normal rate and regular rhythm.   Pulmonary/Chest: Effort normal and breath sounds normal.  Abdominal: Soft. Bowel sounds are normal. There is no tenderness.  Skin: Skin is warm and dry.          Assessment & Plan:  Diarrhea:  Longstanding history of since removal of gallbladder. Initiated on cholestyramine packets last visit and has no diarrhea. No complaints, feeling well.  Exam unremarkable. Lipid panel and LFT due today. Continue same if levels are stable. Labs pending.

## 2015-11-05 NOTE — Progress Notes (Signed)
Pre visit review using our clinic review tool, if applicable. No additional management support is needed unless otherwise documented below in the visit note. 

## 2015-11-29 ENCOUNTER — Ambulatory Visit (INDEPENDENT_AMBULATORY_CARE_PROVIDER_SITE_OTHER): Payer: Medicare Other | Admitting: Family Medicine

## 2015-11-29 ENCOUNTER — Encounter: Payer: Self-pay | Admitting: Family Medicine

## 2015-11-29 VITALS — BP 130/78 | HR 90 | Temp 98.2°F | Ht 64.0 in | Wt 132.4 lb

## 2015-11-29 DIAGNOSIS — L720 Epidermal cyst: Secondary | ICD-10-CM

## 2015-11-29 DIAGNOSIS — I10 Essential (primary) hypertension: Secondary | ICD-10-CM

## 2015-11-29 DIAGNOSIS — E781 Pure hyperglyceridemia: Secondary | ICD-10-CM | POA: Diagnosis not present

## 2015-11-29 DIAGNOSIS — E559 Vitamin D deficiency, unspecified: Secondary | ICD-10-CM | POA: Diagnosis not present

## 2015-11-29 MED ORDER — CHOLINE FENOFIBRATE 135 MG PO CPDR: 135.0000 mg | DELAYED_RELEASE_CAPSULE | Freq: Every day | ORAL | Status: DC

## 2015-11-29 NOTE — Progress Notes (Signed)
Patient ID: Dominique James, female   DOB: [birth year], 65 y.o.   MRN: [id number]  Tommi Rumps, MD Phone: [phone]  RHEMY LANNOM is a 65 y.o. female who presents today for f/u, to establish in our office as a transfer from [facility].  Cyst left lateral leg distal to her knee: Patient notes this has been present for a number of years. Notes it comes and goes. It will fill up and then go away. It is not painful. There is no drainage. There is no fever or warmth to it. She has no fevers with it. No rash overlying it. She states she has seen a Psychologist, sport and exercise and they offered to inject it or remove it. She would like to see a surgeon now to consider removal.  HYPERTENSION Disease Monitoring Home BP Monitoring does check it, though is unable to give numbers Chest pain- no    Dyspnea- no Medications Compliance-  taking atenolol and Accupril. Lightheadedness-  no  Edema- no  Hypertriglyceridemia: Patient notes she recently started back on fenofibrate. She had been on cholestyramine and they took her off her fenofibrate to see if that would be enough to control her diarrhea and cholesterol. Her recent check of triglycerides was in the 600s. She was is restarted back on her fenofibrate.  The patient additionally notes that she's been on vitamin D supplementation for the last 4 weeks.  PMH: nonsmoker,   ROS the history of present illness  Objective  Physical Exam Filed Vitals:   11/29/15 1427  BP: 130/78  Pulse: 90  Temp: 98.2 F (36.8 C)    Physical Exam  Constitutional: She is well-developed, well-nourished, and in no distress.  HENT:  Head: Normocephalic and atraumatic.  Cardiovascular: Normal rate, regular rhythm and normal heart sounds.  Exam reveals no gallop and no friction rub.   No murmur heard. Pulmonary/Chest: Effort normal and breath sounds normal. No respiratory distress. She has no wheezes. She has no rales.  Skin: She is not diaphoretic.         Assessment/Plan: Please see individual problem list.  HYPERTENSION, BENIGN ESSENTIAL At goal today. Continue current medications. Patient advised to monitor blood pressure at home and let us know if it is greater than 140/90.  Hypertriglyceridemia Elevated after coming off of her fenofibrate. We'll refill this today. Recheck in 2 months.  Vitamin D deficiency We'll recheck vitamin D in 2 months at the same time as lipids.  Epidermal cyst Lesion appears to be a cyst on exam, though could be lipoma. No signs of infection. We'll refer to surgery for further evaluation.    Orders Placed This Encounter  Procedures  . Ambulatory referral to General Surgery    Referral Priority:  Routine    Referral Type:  Surgical    Referral Reason:  Specialty Services Required    Requested Specialty:  General Surgery    Number of Visits Requested:  1    Meds ordered this encounter  Medications  . Choline Fenofibrate 135 MG capsule    Sig: Take 1 capsule (135 mg total) by mouth daily.    Dispense:  90 capsule    Refill:  0     Tommi Rumps

## 2015-11-29 NOTE — Progress Notes (Signed)
Pre visit review using our clinic review tool, if applicable. No additional management support is needed unless otherwise documented below in the visit note. 

## 2015-11-29 NOTE — Assessment & Plan Note (Signed)
Elevated after coming off of her fenofibrate. We'll refill this today. Recheck in 2 months.

## 2015-11-29 NOTE — Assessment & Plan Note (Signed)
We'll recheck vitamin D in 2 months at the same time as lipids.

## 2015-11-29 NOTE — Assessment & Plan Note (Signed)
Lesion appears to be a cyst on exam, though could be lipoma. No signs of infection. We'll refer to surgery for further evaluation.

## 2015-11-29 NOTE — Assessment & Plan Note (Signed)
At goal today. Continue current medications. Patient advised to monitor blood pressure at home and let us know if it is greater than 140/90.

## 2015-11-29 NOTE — Patient Instructions (Signed)
Nice to meet you. We will refer you to a surgeon regarding her cyst. Please monitor your blood pressure at home and if it starts to run 140/90 or above please let us know. I refilled fenofibrate for your cholesterol. You should schedule a lab appointment for 8 weeks from now to check your cholesterol and vitamin D. If you develop pain, redness, drainage, increased swelling, fevers, or any new or change in symptoms the seek medical attention.

## 2015-11-30 ENCOUNTER — Encounter: Payer: Self-pay | Admitting: *Deleted

## 2015-12-13 ENCOUNTER — Telehealth: Payer: Self-pay | Admitting: *Deleted

## 2015-12-13 ENCOUNTER — Encounter: Payer: Self-pay | Admitting: General Surgery

## 2015-12-13 ENCOUNTER — Ambulatory Visit (INDEPENDENT_AMBULATORY_CARE_PROVIDER_SITE_OTHER): Payer: Medicare Other | Admitting: General Surgery

## 2015-12-13 VITALS — BP 120/68 | HR 66 | Resp 12 | Ht 67.0 in | Wt 131.0 lb

## 2015-12-13 DIAGNOSIS — M67462 Ganglion, left knee: Secondary | ICD-10-CM

## 2015-12-13 NOTE — Progress Notes (Signed)
Patient ID: Dominique James, female   DOB: 12-04-1950, 65 y.o.   MRN: [id number]  Chief Complaint  Patient presents with  . Cyst    HPI Dominique James is a 65 y.o. female here today for a evaluation of a cyst right knee. Patient states  The cyst has been there for 2 years.  Isn't painfull, no drainage.  Goes and comes back, but is usually there.  I have reviewed the history of present illness with the patient. HPI  Past Medical History  Diagnosis Date  . Myocardial infarction ([place])   . Allergy     Past Surgical History  Procedure Laterality Date  . Endocervical polyp  06/1998    removed  . Breast cyst aspiration      left  . Cholecystectomy    . Ganglion cyst excision  02/2007    left knee    Family History  Problem Relation Age of Onset  . Cancer Mother     colon  . Diabetes Mother   . Hypertension Mother   . Cancer Father 75    throat  . Fibromyalgia Sister   . Heart disease Brother   . Alcohol abuse Son   . Drug abuse Son   . Cancer Maternal Grandmother     brest  . Anemia Brother     Social History Social History  Substance Use Topics  . Smoking status: Never Smoker   . Smokeless tobacco: None  . Alcohol Use: None    Allergies  Allergen Reactions  . Alendronate Sodium     REACTION: hip pain  . Atorvastatin     REACTION: myalgias  . Ezetimibe     REACTION: side effects  . Penicillins Itching  . Rosuvastatin     REACTION: myalgias, GI upset  . Simvastatin     REACTION: myalgias    Current Outpatient Prescriptions  Medication Sig Dispense Refill  . atenolol (TENORMIN) 50 MG tablet TAKE 1 TABLET BY MOUTH ONCE A DAY 90 tablet 1  . cholestyramine (QUESTRAN) 4 G packet Take 1 packet (4 g total) by mouth daily. 30 each 5  . Choline Fenofibrate 135 MG capsule Take 1 capsule (135 mg total) by mouth daily. 90 capsule 0  . Omeprazole-Sodium Bicarbonate (ZEGERID OTC) 20-1100 MG CAPS capsule TAKE 1 CAPSULE BY MOUTH DAILY BEFORE BREAKFAST. 30 capsule 3   . quinapril (ACCUPRIL) 10 MG tablet TAKE 1 TABLET BY MOUTH DAILY 30 tablet 6  . Vitamin D, Ergocalciferol, (DRISDOL) 50000 UNITS CAPS capsule Take one capsule by mouth weekly x 6 weeks. 6 capsule 0   No current facility-administered medications for this visit.    Review of Systems Review of Systems  Constitutional: Negative.   Respiratory: Negative.   Cardiovascular: Negative.     Blood pressure 120/68, pulse 66, resp. rate 12, height 5\' 7"  (1.702 m), weight 131 lb (59.421 kg).  Physical Exam Physical Exam  Constitutional: She is oriented to person, place, and time. She appears well-developed and well-nourished.  Eyes: No scleral icterus.  Neurological: She is alert and oriented to person, place, and time.  Skin: Skin is warm and dry.     5cm tense cystic mass lateral left knee, free of skin, not moveable.   Data Reviewed Prior notes. She was actually seen for this lump in 2008 and noted this was  a ganglion. She did see ortho at that time.  Assessment    Left lateral knee ganglion-has grown about 2 more centimeters since last  evaluation in 2008. Likely a large ganglion She had seen orthopedist in 2008     Plan    Recommendation to see an orthopedic surgeon for evaluation. Referral to be made. Pt advised fully  Patient has been scheduled for an appointment with Dr. Sabra Heck at [facility] for 12-27-15 at 9:45 am (arrive 9:30 am).     PCP:  Leone Haven This information has been scribed by Sebastian Ache.    SANKAR,SEEPLAPUTHUR G 12/14/2015, 1:29 PM

## 2015-12-13 NOTE — Telephone Encounter (Signed)
Called patient's home and cell number but not able to leave a message.  Need to inform of appointment with Dr. Sabra Heck.   Patient called back this afternoon and spoke with Carollee Leitz, RN and was notified of date, time, and instructions. She verbalizes understanding.

## 2015-12-13 NOTE — Patient Instructions (Signed)
Patient has been scheduled for an appointment with Dr. Sabra Heck at [facility] for 12-27-15 at 9:45 am (arrive 9:30 am).

## 2015-12-14 ENCOUNTER — Encounter: Payer: Self-pay | Admitting: General Surgery

## 2015-12-25 ENCOUNTER — Other Ambulatory Visit: Payer: Self-pay | Admitting: Primary Care

## 2015-12-27 NOTE — Telephone Encounter (Signed)
Electronically refill request for   cholestyramine (QUESTRAN) 4 G packet   Take 1 packet (4 g total) by mouth daily.  Dispense: 30 each   Refills: 5     Last prescribed on 07/12/2015 by Anda Kraft. Dr Caryl Bis' s patient. Follow up on 02/28/2016.

## 2015-12-27 NOTE — Telephone Encounter (Signed)
To PCP

## 2016-01-08 ENCOUNTER — Other Ambulatory Visit: Payer: Self-pay | Admitting: Family Medicine

## 2016-01-10 ENCOUNTER — Other Ambulatory Visit: Payer: Self-pay | Admitting: Family Medicine

## 2016-01-10 MED ORDER — ATENOLOL 50 MG PO TABS: 50.0000 mg | ORAL_TABLET | Freq: Every day | ORAL | Status: DC

## 2016-01-10 NOTE — Telephone Encounter (Signed)
Refill sent to pharmacy.   

## 2016-01-10 NOTE — Telephone Encounter (Signed)
Pt is requesting a refill on Atenolol 50mg  tab. Pt last OV 11/29/15, last filled 07/06/15 #90tabs with 1refill. Please advise, thanks

## 2016-01-10 NOTE — Telephone Encounter (Signed)
Pt called about needing atenolol (TENORMIN) 50 MG tablet. Pt states she forgot to ask Dr Caryl Bis about it. Pharmacy is [id number] [place], [place] - [address]. Call pt @ [phone]. Thank you!

## 2016-01-24 ENCOUNTER — Other Ambulatory Visit (INDEPENDENT_AMBULATORY_CARE_PROVIDER_SITE_OTHER): Payer: Medicare Other

## 2016-01-24 DIAGNOSIS — R739 Hyperglycemia, unspecified: Secondary | ICD-10-CM | POA: Diagnosis not present

## 2016-01-24 DIAGNOSIS — E781 Pure hyperglyceridemia: Secondary | ICD-10-CM | POA: Diagnosis not present

## 2016-01-24 DIAGNOSIS — E559 Vitamin D deficiency, unspecified: Secondary | ICD-10-CM

## 2016-01-24 LAB — LIPID PANEL
CHOLESTEROL: 192 mg/dL (ref 0–200)
HDL: 33.1 mg/dL — ABNORMAL LOW (ref >39.00)
LDL Cholesterol: 120 mg/dL — ABNORMAL HIGH (ref 0–99)
NonHDL: 159.31
TRIGLYCERIDES: 195 mg/dL — AB (ref 0.0–149.0)
Total CHOL/HDL Ratio: 6
VLDL: 39 mg/dL (ref 0.0–40.0)

## 2016-02-07 ENCOUNTER — Other Ambulatory Visit: Payer: Medicare Other

## 2016-02-17 ENCOUNTER — Other Ambulatory Visit: Payer: Self-pay | Admitting: Family Medicine

## 2016-02-27 ENCOUNTER — Other Ambulatory Visit: Payer: Self-pay | Admitting: Family Medicine

## 2016-02-28 ENCOUNTER — Encounter: Payer: Self-pay | Admitting: Family Medicine

## 2016-02-28 ENCOUNTER — Ambulatory Visit (INDEPENDENT_AMBULATORY_CARE_PROVIDER_SITE_OTHER): Payer: Medicare Other | Admitting: Family Medicine

## 2016-02-28 VITALS — BP 128/82 | HR 71 | Temp 98.4°F | Ht 64.0 in | Wt 133.0 lb

## 2016-02-28 DIAGNOSIS — E559 Vitamin D deficiency, unspecified: Secondary | ICD-10-CM

## 2016-02-28 DIAGNOSIS — K219 Gastro-esophageal reflux disease without esophagitis: Secondary | ICD-10-CM

## 2016-02-28 DIAGNOSIS — E781 Pure hyperglyceridemia: Secondary | ICD-10-CM

## 2016-02-28 DIAGNOSIS — M6740 Ganglion, unspecified site: Secondary | ICD-10-CM | POA: Diagnosis not present

## 2016-02-28 DIAGNOSIS — I10 Essential (primary) hypertension: Secondary | ICD-10-CM

## 2016-02-28 LAB — COMPREHENSIVE METABOLIC PANEL
ALT: 21 U/L (ref 0–35)
AST: 17 U/L (ref 0–37)
Albumin: 4.3 g/dL (ref 3.5–5.2)
Alkaline Phosphatase: 52 U/L (ref 39–117)
BILIRUBIN TOTAL: 0.4 mg/dL (ref 0.2–1.2)
BUN: 19 mg/dL (ref 6–23)
CO2: 27 meq/L (ref 19–32)
CREATININE: 1.04 mg/dL (ref 0.40–1.20)
Calcium: 9.6 mg/dL (ref 8.4–10.5)
Chloride: 104 mEq/L (ref 96–112)
GFR: 56.3 mL/min — ABNORMAL LOW (ref >60.00)
GLUCOSE: 126 mg/dL — AB (ref 70–99)
Potassium: 4.1 mEq/L (ref 3.5–5.1)
Sodium: 140 mEq/L (ref 135–145)
Total Protein: 7.3 g/dL (ref 6.0–8.3)

## 2016-02-28 LAB — HEMOGLOBIN A1C: Hgb A1c MFr Bld: 6.7 % — ABNORMAL HIGH (ref 4.6–6.5)

## 2016-02-28 LAB — VITAMIN D 25 HYDROXY (VIT D DEFICIENCY, FRACTURES): VITD: 26.19 ng/mL — AB (ref 30.00–100.00)

## 2016-02-28 MED ORDER — QUINAPRIL HCL 10 MG PO TABS: 10.0000 mg | ORAL_TABLET | Freq: Every day | ORAL | Status: DC

## 2016-02-28 NOTE — Patient Instructions (Addendum)
Nice to see you. Please start exercising. We will check some labs today and call you with the results. Please monitor the ganglion cyst and if it worsens or becomes bothersome please let us know. Please complete the stool cards. Consider getting a mammogram.

## 2016-02-28 NOTE — Assessment & Plan Note (Signed)
Has seen orthopedic surgery for this. Did not respond to drainage and cortisone shot. No discomfort or signs of infection over the cyst. Patient would prefer to just continue to monitor at this time. She'll continue to monitor.

## 2016-02-28 NOTE — Progress Notes (Signed)
Pre visit review using our clinic review tool, if applicable. No additional management support is needed unless otherwise documented below in the visit note. 

## 2016-02-28 NOTE — Assessment & Plan Note (Signed)
Well controlled. Continue current medication.  

## 2016-02-28 NOTE — Assessment & Plan Note (Signed)
At goal. Continue current medications. Check CMP today. 

## 2016-02-28 NOTE — Assessment & Plan Note (Signed)
Recheck vitamin D

## 2016-02-28 NOTE — Assessment & Plan Note (Signed)
Much improved on last check. Continue fenofibrate.

## 2016-02-28 NOTE — Progress Notes (Signed)
Patient ID: Dominique James, female   DOB: 1951-05-19, 65 y.o.   MRN: [id number]  Dominique Alar, MD Phone: [phone]  Dominique James is a 65 y.o. female who presents today for follow-up.  HYPERTENSION Disease Monitoring Home BP Monitoring not checking Chest pain- no    Dyspnea- no Medications Compliance-  Taking atenolol and quinipril.  Edema- no  Hypertriglyceridemia: Patient notes she is taking fenofibrate. Her triglycerides came down significantly at her last check. She plans to start exercising soon. Not doing any exercise now.  Diarrhea: Patient notes this is significantly improved by using over-the-counter antidiarrheal tablet. Notes she has had intermittent diarrhea since she had her gallbladder removed. She notes she's having formed stools now. No abdominal pain. No blood in her stool.  Vitamin D deficiency: She is taking an over-the-counter supplement 3 tablets daily. Due for recheck today.  GERD: No issues with reflux if she takes her PPI. Notes she does if she does not take the medicine she gets a bile taste and sourness in the back of her throat. She has seen GI for this and per her report had an EGD. Reports this was negative.  Left knee ganglion cyst: Notes she saw a general surgeon and orthopedic surgeon. Had it drained and had cortisone injected. No pain. Still there. States the orthopedic surgeon told her not to come back without MRI. She took meloxicam for this and notes she had hives and a rash related to that.   PMH: nonsmoker.   ROS see history of present illness  Objective  Physical Exam Filed Vitals:   02/28/16 0756  BP: 128/82  Pulse: 71  Temp: 98.4 F (36.9 C)    BP Readings from Last 3 Encounters:  02/28/16 128/82  12/13/15 120/68  11/29/15 130/78   Wt Readings from Last 3 Encounters:  02/28/16 133 lb (60.328 kg)  12/13/15 131 lb (59.421 kg)  11/29/15 132 lb 6.4 oz (60.056 kg)    Physical Exam  Constitutional: She is well-developed,  well-nourished, and in no distress.  HENT:  Head: Normocephalic and atraumatic.  Cardiovascular: Normal rate, regular rhythm and normal heart sounds.   Pulmonary/Chest: Effort normal.  Abdominal: Soft. Bowel sounds are normal. She exhibits no distension. There is no tenderness. There is no rebound and no guarding.  Musculoskeletal: She exhibits no edema.  Left lateral knee with ganglion cyst distal to the joint line, no joint line tenderness, no tenderness of the ganglion cyst, no warmth, no erythema, no ligamentous laxity, negative McMurray's, right knee with no swelling, joint line tenderness, warmth, erythema, or ligament laxity, negative McMurray's of the right knee  Neurological: She is alert. Gait normal.  Skin: Skin is warm and dry. She is not diaphoretic.     Assessment/Plan: Please see individual problem list.  HYPERTENSION, BENIGN ESSENTIAL At goal. Continue current medications. Check CMP today.  Hypertriglyceridemia Much improved on last check. Continue fenofibrate.  Vitamin D deficiency Recheck vitamin D.  Ganglion cyst Has seen orthopedic surgery for this. Did not respond to drainage and cortisone shot. No discomfort or signs of infection over the cyst. Patient would prefer to just continue to monitor at this time. She'll continue to monitor.  G E R D Well-controlled. Continue current medication.    Orders Placed This Encounter  Procedures  . Vitamin D (25 hydroxy)  . Comp Met (CMET)  . HgB A1c    Meds ordered this encounter  Medications  . quinapril (ACCUPRIL) 10 MG tablet    Sig: Take  1 tablet (10 mg total) by mouth daily.    Dispense:  90 tablet    Refill:  3    # Healthcare maintenance: patient given stool cards for colon cancer screening. She wants to think about getting a mammogram.    Tommi Rumps, MD [place]

## 2016-03-06 ENCOUNTER — Telehealth: Payer: Self-pay | Admitting: Family Medicine

## 2016-03-06 MED ORDER — FENOFIBRIC ACID 135 MG PO CPDR
DELAYED_RELEASE_CAPSULE | ORAL | Status: DC
Start: 2016-03-06 — End: 2016-08-30

## 2016-03-06 NOTE — Telephone Encounter (Signed)
Caller name: Dominique James Relationship to patient: patient Can be reached: [phone]  Reason for call: pt called to request refill on Fenofibric Acid. [place].

## 2016-04-25 ENCOUNTER — Other Ambulatory Visit: Payer: Medicare Other

## 2016-04-25 ENCOUNTER — Other Ambulatory Visit (INDEPENDENT_AMBULATORY_CARE_PROVIDER_SITE_OTHER): Payer: Medicare Other

## 2016-04-25 DIAGNOSIS — Z1211 Encounter for screening for malignant neoplasm of colon: Secondary | ICD-10-CM

## 2016-04-25 LAB — FECAL OCCULT BLOOD, IMMUNOCHEMICAL: Fecal Occult Bld: NEGATIVE

## 2016-04-25 NOTE — Addendum Note (Signed)
Addended by: Frutoso Chase A on: 04/25/2016 08:31 AM   Modules accepted: Orders

## 2016-04-27 ENCOUNTER — Encounter: Payer: Self-pay | Admitting: Family Medicine

## 2016-05-30 ENCOUNTER — Ambulatory Visit: Payer: Medicare Other | Admitting: Family Medicine

## 2016-07-04 ENCOUNTER — Other Ambulatory Visit: Payer: Self-pay | Admitting: Family Medicine

## 2016-07-10 ENCOUNTER — Encounter: Payer: Self-pay | Admitting: Family Medicine

## 2016-07-10 ENCOUNTER — Ambulatory Visit (INDEPENDENT_AMBULATORY_CARE_PROVIDER_SITE_OTHER): Payer: Medicare Other | Admitting: Family Medicine

## 2016-07-10 VITALS — BP 136/84 | HR 71 | Temp 97.8°F | Wt 132.4 lb

## 2016-07-10 DIAGNOSIS — Z1211 Encounter for screening for malignant neoplasm of colon: Secondary | ICD-10-CM

## 2016-07-10 DIAGNOSIS — I10 Essential (primary) hypertension: Secondary | ICD-10-CM

## 2016-07-10 DIAGNOSIS — E119 Type 2 diabetes mellitus without complications: Secondary | ICD-10-CM

## 2016-07-10 DIAGNOSIS — Z1239 Encounter for other screening for malignant neoplasm of breast: Secondary | ICD-10-CM | POA: Diagnosis not present

## 2016-07-10 DIAGNOSIS — E781 Pure hyperglyceridemia: Secondary | ICD-10-CM | POA: Diagnosis not present

## 2016-07-10 DIAGNOSIS — K219 Gastro-esophageal reflux disease without esophagitis: Secondary | ICD-10-CM

## 2016-07-10 DIAGNOSIS — R197 Diarrhea, unspecified: Secondary | ICD-10-CM

## 2016-07-10 DIAGNOSIS — I252 Old myocardial infarction: Secondary | ICD-10-CM

## 2016-07-10 LAB — MICROALBUMIN / CREATININE URINE RATIO
Creatinine,U: 158.1 mg/dL
MICROALB UR: 1.5 mg/dL (ref 0.0–1.9)
Microalb Creat Ratio: 0.9 mg/g (ref 0.0–30.0)

## 2016-07-10 LAB — LIPID PANEL
Cholesterol: 196 mg/dL (ref 0–200)
HDL: 30.3 mg/dL — AB (ref >39.00)
NONHDL: 165.97
Total CHOL/HDL Ratio: 6
Triglycerides: 217 mg/dL — ABNORMAL HIGH (ref 0.0–149.0)
VLDL: 43.4 mg/dL — ABNORMAL HIGH (ref 0.0–40.0)

## 2016-07-10 LAB — COMPREHENSIVE METABOLIC PANEL
ALT: 17 U/L (ref 0–35)
AST: 16 U/L (ref 0–37)
Albumin: 4.2 g/dL (ref 3.5–5.2)
Alkaline Phosphatase: 53 U/L (ref 39–117)
BILIRUBIN TOTAL: 0.5 mg/dL (ref 0.2–1.2)
BUN: 23 mg/dL (ref 6–23)
CO2: 26 meq/L (ref 19–32)
Calcium: 9.2 mg/dL (ref 8.4–10.5)
Chloride: 104 mEq/L (ref 96–112)
Creatinine, Ser: 1.04 mg/dL (ref 0.40–1.20)
GFR: 56.24 mL/min — AB (ref >60.00)
GLUCOSE: 122 mg/dL — AB (ref 70–99)
Potassium: 4.2 mEq/L (ref 3.5–5.1)
SODIUM: 138 meq/L (ref 135–145)
Total Protein: 7.2 g/dL (ref 6.0–8.3)

## 2016-07-10 LAB — HEMOGLOBIN A1C: HEMOGLOBIN A1C: 6.6 % — AB (ref 4.6–6.5)

## 2016-07-10 LAB — LDL CHOLESTEROL, DIRECT: Direct LDL: 130 mg/dL

## 2016-07-10 NOTE — Assessment & Plan Note (Signed)
Patient with chronic intermittent diarrhea since having her gallbladder removed. No other associated symptoms with this. Benign abdominal exam. She'll continue as needed Imodium. Advised that if she develops any new symptoms with this issue she should stop the Imodium and let us know.

## 2016-07-10 NOTE — Assessment & Plan Note (Signed)
History of MI many years ago. Advised to start on an aspirin. We'll continue to monitor her cholesterol. Has been intolerant of statins in the past. Could consider PCSK9 inhibitor in the future.

## 2016-07-10 NOTE — Patient Instructions (Addendum)
Nice to see you. We will check some lab work today including call you the results. Please start a baby aspirin daily. Please continue your reflux medication and Imodium. We will get her set up for mammogram. Please consider getting flu shot and pneumonia vaccine. Please see an eye doctor.

## 2016-07-10 NOTE — Assessment & Plan Note (Signed)
Continue fenofibrate. Check lipid panel today.

## 2016-07-10 NOTE — Progress Notes (Signed)
Pre visit review using our clinic review tool, if applicable. No additional management support is needed unless otherwise documented below in the visit note. 

## 2016-07-10 NOTE — Assessment & Plan Note (Signed)
At goal. Continue current medications. Check CMP today. 

## 2016-07-10 NOTE — Progress Notes (Signed)
  Tommi Rumps, MD Phone: [phone]  Dominique James is a 65 y.o. female who presents today for f/u.  HYPERTENSION Disease Monitoring: Blood pressure range-rarely checks, states it is good Chest pain, palpitations- no      Dyspnea- no Medications: Compliance- taking quinapril and atenolol   Edema- no Reports history of MI in her 74s. No recurrent issues with this. Does not take an aspirin. Has several allergies to statins.  Diabetes: Recently diagnosed with an A1c of 6.7. She has started to work on exercise by walking 3 times a week for about 20 minutes. Note she does like to eat though doesn't like a lot of sweets. Does eat out a fair amount.  Hypertriglyceridemia: Working on exercise. Taking fenofibrate. Her triglycerides came down significantly at her last check.  GERD: Notes no symptoms while taking Zegerid. No blood in her stool. No abdominal pain. Has a history of an upper endoscopy that she reports was normal.  She notes intermittent diarrhea since having her gallbladder out many years ago. No blood in her stool. No mucus in her stool. No abdominal pain with this. Takes Imodium infrequently for this with good benefit.   PMH: nonsmoker.   ROS see history of present illness  Objective  Physical Exam Vitals:   07/10/16 0824  BP: 136/84  Pulse: 71  Temp: 97.8 F (36.6 C)    BP Readings from Last 3 Encounters:  07/10/16 136/84  02/28/16 128/82  12/13/15 120/68   Wt Readings from Last 3 Encounters:  07/10/16 132 lb 6.4 oz (60.1 kg)  02/28/16 133 lb (60.3 kg)  12/13/15 131 lb (59.4 kg)    Physical Exam  Constitutional: No distress.  Cardiovascular: Normal rate, regular rhythm and normal heart sounds.   Pulmonary/Chest: Effort normal and breath sounds normal.  Abdominal: Soft. Bowel sounds are normal. She exhibits no distension. There is no tenderness. There is no rebound and no guarding.  Musculoskeletal: She exhibits no edema.  Neurological: She is alert.  Gait normal.  Skin: Skin is warm and dry. She is not diaphoretic.     Assessment/Plan: Please see individual problem list.  HYPERTENSION, BENIGN ESSENTIAL At goal. Continue current medications. Check CMP today.  MYOCARDIAL INFARCTION, HX OF History of MI many years ago. Advised to start on an aspirin. We'll continue to monitor her cholesterol. Has been intolerant of statins in the past. Could consider PCSK9 inhibitor in the future.   G E R D Well-controlled at this time. Continue current medications. Continue to monitor.  Hypertriglyceridemia Continue fenofibrate. Check lipid panel today.  Diarrhea Patient with chronic intermittent diarrhea since having her gallbladder removed. No other associated symptoms with this. Benign abdominal exam. She'll continue as needed Imodium. Advised that if she develops any new symptoms with this issue she should stop the Imodium and let us know.   Orders Placed This Encounter  Procedures  . MM Digital Screening    Standing Status:   Future    Standing Expiration Date:   09/09/2017    Order Specific Question:   Reason for Exam (SYMPTOM  OR DIAGNOSIS REQUIRED)    Answer:   breast cancer screening    Order Specific Question:   Preferred imaging location?    Answer:   [facility]  . HgB A1c  . Comp Met (CMET)  . Lipid Profile  . Urine Microalbumin w/creat. ratio    Tommi Rumps, MD [place]

## 2016-07-10 NOTE — Assessment & Plan Note (Signed)
Well-controlled at this time. Continue current medications. Continue to monitor.

## 2016-08-30 ENCOUNTER — Other Ambulatory Visit: Payer: Self-pay | Admitting: Family Medicine

## 2016-08-30 NOTE — Telephone Encounter (Signed)
Refill sent to pharmacy.   

## 2016-08-30 NOTE — Telephone Encounter (Signed)
Please advise on refill.

## 2016-09-04 ENCOUNTER — Other Ambulatory Visit: Payer: Self-pay | Admitting: Family Medicine

## 2016-09-04 ENCOUNTER — Ambulatory Visit
Admission: RE | Admit: 2016-09-04 | Discharge: 2016-09-04 | Disposition: A | Discharge status: Home / Self Care | Payer: Medicare Other | Source: Ambulatory Visit | Attending: Family Medicine | Admitting: Family Medicine

## 2016-09-04 DIAGNOSIS — Z1239 Encounter for other screening for malignant neoplasm of breast: Secondary | ICD-10-CM

## 2016-09-04 DIAGNOSIS — Z1231 Encounter for screening mammogram for malignant neoplasm of breast: Secondary | ICD-10-CM | POA: Diagnosis not present

## 2016-10-09 ENCOUNTER — Ambulatory Visit (INDEPENDENT_AMBULATORY_CARE_PROVIDER_SITE_OTHER): Payer: Medicare Other | Admitting: Family Medicine

## 2016-10-09 ENCOUNTER — Encounter: Payer: Self-pay | Admitting: Family Medicine

## 2016-10-09 VITALS — BP 120/82 | HR 73 | Temp 98.3°F | Wt 135.0 lb

## 2016-10-09 DIAGNOSIS — E119 Type 2 diabetes mellitus without complications: Secondary | ICD-10-CM | POA: Insufficient documentation

## 2016-10-09 DIAGNOSIS — I10 Essential (primary) hypertension: Secondary | ICD-10-CM | POA: Diagnosis not present

## 2016-10-09 DIAGNOSIS — E781 Pure hyperglyceridemia: Secondary | ICD-10-CM | POA: Diagnosis not present

## 2016-10-09 DIAGNOSIS — K219 Gastro-esophageal reflux disease without esophagitis: Secondary | ICD-10-CM

## 2016-10-09 DIAGNOSIS — E118 Type 2 diabetes mellitus with unspecified complications: Secondary | ICD-10-CM | POA: Insufficient documentation

## 2016-10-09 LAB — COMPREHENSIVE METABOLIC PANEL
ALT: 21 U/L (ref 0–35)
AST: 18 U/L (ref 0–37)
Albumin: 4.2 g/dL (ref 3.5–5.2)
Alkaline Phosphatase: 56 U/L (ref 39–117)
BUN: 19 mg/dL (ref 6–23)
CO2: 25 meq/L (ref 19–32)
Calcium: 9.7 mg/dL (ref 8.4–10.5)
Chloride: 104 mEq/L (ref 96–112)
Creatinine, Ser: 1.11 mg/dL (ref 0.40–1.20)
GFR: 52.13 mL/min — AB (ref >60.00)
GLUCOSE: 124 mg/dL — AB (ref 70–99)
POTASSIUM: 4.6 meq/L (ref 3.5–5.1)
SODIUM: 139 meq/L (ref 135–145)
Total Bilirubin: 0.4 mg/dL (ref 0.2–1.2)
Total Protein: 7 g/dL (ref 6.0–8.3)

## 2016-10-09 LAB — LDL CHOLESTEROL, DIRECT: Direct LDL: 145 mg/dL

## 2016-10-09 LAB — HEMOGLOBIN A1C: Hgb A1c MFr Bld: 6.7 % — ABNORMAL HIGH (ref 4.6–6.5)

## 2016-10-09 NOTE — Assessment & Plan Note (Signed)
Continue fenofibrate. Check direct LDL today. Could consider statin therapy or PCSK-9 inhibitor if LDL not near goal.

## 2016-10-09 NOTE — Patient Instructions (Signed)
Nice to see you.  We will check some lab work today and call with the results.  Please continue the zegrid. When you decide you want to see GI please let us know.

## 2016-10-09 NOTE — Assessment & Plan Note (Addendum)
Check A1c today.

## 2016-10-09 NOTE — Progress Notes (Signed)
  Tommi Rumps, MD Phone: [phone]  Dominique James is a 65 y.o. female who presents today for follow-up.  Hypertension: Checking and reports it is good. No chest pain, shortness breath, or edema. Taking atenolol and quinapril.  GERD: Taking Zegerid daily for many years. No reflux, sour taste, blood, or abdominal pain. Notes she has no symptoms if she takes medication though she if she does not take the medication she feels as though she throws up. Had a prior EGD that was many years ago. Not ready to see GI again.  Hypertriglyceridemia: Taking fenofibrate. No claudication. Had been working on diet and exercise until several weeks ago when her mother died.  She reports that her mother's death was relatively sudden. They took her for a checkup and her sodium was low and then she ended up in hospice for 2 weeks. Patient notes she is trying to deal with this on her own. She does have people she can talk to. She has a Theme park manager that did offer to come talk to her. She doesn't want to talk about this at length with anybody as she feels as though she needs to deal with it on her own.  Diabetes: No polyuria or polydipsia. Had been working on diet and exercise.  PMH: nonsmoker.   ROS see history of present illness  Objective  Physical Exam Vitals:   10/09/16 0803  BP: 120/82  Pulse: 73  Temp: 98.3 F (36.8 C)    BP Readings from Last 3 Encounters:  10/09/16 120/82  07/10/16 136/84  02/28/16 128/82   Wt Readings from Last 3 Encounters:  10/09/16 135 lb (61.2 kg)  07/10/16 132 lb 6.4 oz (60.1 kg)  02/28/16 133 lb (60.3 kg)    Physical Exam  Constitutional: No distress.  Cardiovascular: Normal rate, regular rhythm and normal heart sounds.   Pulmonary/Chest: Effort normal and breath sounds normal.  Musculoskeletal: She exhibits no edema.  Neurological: She is alert. Gait normal.  Skin: She is not diaphoretic.     Assessment/Plan: Please see individual problem  list.  HYPERTENSION, BENIGN ESSENTIAL At goal. Continue current medications. CMP today.  G E R D Well-controlled on current medication. Continue current medication. Did discuss given that she has persistent symptoms with stopping medication she would benefit from seeing GI. She wants to hold off on this at this point. She'll let us know when she wants to see them.  Type 2 diabetes mellitus without complication, without long-term current use of insulin (HCC) Check A1c today.  Hypertriglyceridemia Continue fenofibrate. Check direct LDL today. Could consider statin therapy or PCSK-9 inhibitor if LDL not near goal.  Offered support and any help we can provide if she needs it surrounding issues regarding her mother's death.  Orders Placed This Encounter  Procedures  . Comp Met (CMET)  . HgB A1c  . Direct LDL    Tommi Rumps, MD [place]

## 2016-10-09 NOTE — Assessment & Plan Note (Signed)
Well-controlled on current medication. Continue current medication. Did discuss given that she has persistent symptoms with stopping medication she would benefit from seeing GI. She wants to hold off on this at this point. She'll let us know when she wants to see them.

## 2016-10-09 NOTE — Progress Notes (Signed)
Pre visit review using our clinic review tool, if applicable. No additional management support is needed unless otherwise documented below in the visit note. 

## 2016-10-09 NOTE — Assessment & Plan Note (Signed)
At goal. Continue current medications. CMP today.

## 2017-01-04 ENCOUNTER — Other Ambulatory Visit: Payer: Self-pay | Admitting: Family Medicine

## 2017-01-08 ENCOUNTER — Encounter: Payer: Self-pay | Admitting: Family Medicine

## 2017-01-08 ENCOUNTER — Ambulatory Visit (INDEPENDENT_AMBULATORY_CARE_PROVIDER_SITE_OTHER): Payer: Medicare Other | Admitting: Family Medicine

## 2017-01-08 VITALS — BP 138/80 | HR 63 | Temp 98.7°F | Wt 131.8 lb

## 2017-01-08 DIAGNOSIS — E781 Pure hyperglyceridemia: Secondary | ICD-10-CM | POA: Diagnosis not present

## 2017-01-08 DIAGNOSIS — E119 Type 2 diabetes mellitus without complications: Secondary | ICD-10-CM

## 2017-01-08 DIAGNOSIS — E7800 Pure hypercholesterolemia, unspecified: Secondary | ICD-10-CM

## 2017-01-08 DIAGNOSIS — I10 Essential (primary) hypertension: Secondary | ICD-10-CM | POA: Diagnosis not present

## 2017-01-08 LAB — COMPREHENSIVE METABOLIC PANEL
ALK PHOS: 48 U/L (ref 39–117)
ALT: 23 U/L (ref 0–35)
AST: 21 U/L (ref 0–37)
Albumin: 4.4 g/dL (ref 3.5–5.2)
BILIRUBIN TOTAL: 0.5 mg/dL (ref 0.2–1.2)
BUN: 23 mg/dL (ref 6–23)
CO2: 28 mEq/L (ref 19–32)
Calcium: 9.9 mg/dL (ref 8.4–10.5)
Chloride: 104 mEq/L (ref 96–112)
Creatinine, Ser: 1.15 mg/dL (ref 0.40–1.20)
GFR: 50 mL/min — ABNORMAL LOW (ref >60.00)
Glucose, Bld: 117 mg/dL — ABNORMAL HIGH (ref 70–99)
POTASSIUM: 4.2 meq/L (ref 3.5–5.1)
SODIUM: 139 meq/L (ref 135–145)
TOTAL PROTEIN: 7.5 g/dL (ref 6.0–8.3)

## 2017-01-08 LAB — LDL CHOLESTEROL, DIRECT: Direct LDL: 123 mg/dL

## 2017-01-08 LAB — HEMOGLOBIN A1C: Hgb A1c MFr Bld: 6.6 % — ABNORMAL HIGH (ref 4.6–6.5)

## 2017-01-08 NOTE — Assessment & Plan Note (Signed)
Check A1c.  Continue diet and exercise. 

## 2017-01-08 NOTE — Patient Instructions (Signed)
Nice to see you. We'll have you return in 2 weeks for recheck of your blood pressure. We'll check some lab work and contact you with the results. Please continue with your diet and exercise.

## 2017-01-08 NOTE — Assessment & Plan Note (Signed)
Slightly above goal today. At goal at home. She'll return in 2 weeks for recheck. Check daily until then and bring in her readings. She'll continue her current medications. Labs as outlined below.

## 2017-01-08 NOTE — Progress Notes (Signed)
  Tommi Rumps, MD Phone: [phone]  Dominique James is a 66 y.o. female who presents today for f/u.  HYPERTENSION  Disease Monitoring  Home BP Monitoring 130/80 Chest pain- no    Dyspnea- no Medications  Compliance-  Taking atenolol, quinapril.  Edema- no  Diabetes: No polyuria or polydipsia. Not checking her sugars. She has cut carbs completely out of her diet. Doing more of a paleo diet. Is exercising most days out of the week.  Hypertriglyceridemia: No myalgias or right upper quadrant pain. She's taking fenofibrate. Has been intolerant of statins in the past. Also has elevated LDL.   PMH: nonsmoker.   ROS see history of present illness  Objective  Physical Exam Vitals:   01/08/17 0847  BP: 138/80  Pulse: 63  Temp: 98.7 F (37.1 C)    BP Readings from Last 3 Encounters:  01/08/17 138/80  10/09/16 120/82  07/10/16 136/84   Wt Readings from Last 3 Encounters:  01/08/17 131 lb 12.8 oz (59.8 kg)  10/09/16 135 lb (61.2 kg)  07/10/16 132 lb 6.4 oz (60.1 kg)    Physical Exam  Constitutional: No distress.  Cardiovascular: Normal rate, regular rhythm and normal heart sounds.   Pulmonary/Chest: Effort normal and breath sounds normal.  Musculoskeletal: She exhibits no edema.  Neurological: She is alert. Gait normal.  Skin: Skin is warm and dry. She is not diaphoretic.     Assessment/Plan: Please see individual problem list.  HYPERTENSION, BENIGN ESSENTIAL Slightly above goal today. At goal at home. She'll return in 2 weeks for recheck. Check daily until then and bring in her readings. She'll continue her current medications. Labs as outlined below.  Type 2 diabetes mellitus without complication, without long-term current use of insulin (HCC) Check A1c. Continue diet and exercise.  Hypertriglyceridemia Continue fenofibrate. LDL has been elevated as well the past. We'll recheck this today given her diet changes.   Orders Placed This Encounter  Procedures   . Direct LDL  . HgB A1c  . Comp Met (CMET)    Tommi Rumps, MD [place]

## 2017-01-08 NOTE — Assessment & Plan Note (Signed)
Continue fenofibrate. LDL has been elevated as well the past. We'll recheck this today given her diet changes.

## 2017-01-08 NOTE — Progress Notes (Signed)
Pre visit review using our clinic review tool, if applicable. No additional management support is needed unless otherwise documented below in the visit note. 

## 2017-01-23 ENCOUNTER — Ambulatory Visit (INDEPENDENT_AMBULATORY_CARE_PROVIDER_SITE_OTHER): Payer: Medicare Other | Admitting: *Deleted

## 2017-01-23 ENCOUNTER — Encounter: Payer: Self-pay | Admitting: *Deleted

## 2017-01-23 VITALS — BP 128/70 | HR 65 | Resp 14

## 2017-01-23 DIAGNOSIS — I10 Essential (primary) hypertension: Secondary | ICD-10-CM

## 2017-01-23 NOTE — Progress Notes (Signed)
Well-controlled BP. Continue current medications.  Tommi Rumps, M.D.

## 2017-01-23 NOTE — Progress Notes (Signed)
Patient presented for post X 2 week visit BP check last visit BP 138/80, patient also submitted to nurse copy of BP reading for 2 weeks at home. Nurse gave to [facility] for PCP. BP reading today left arm 120/68 pulse 68, after several minutes BP checked in right arm 128/70 pulse 65.

## 2017-01-24 ENCOUNTER — Telehealth: Payer: Self-pay | Admitting: Family Medicine

## 2017-01-24 NOTE — Progress Notes (Signed)
Left message for patient to return call to office. 

## 2017-01-24 NOTE — Telephone Encounter (Signed)
Pt called back returning your call. Thank you!  Call pt @ [phone]

## 2017-01-25 NOTE — Progress Notes (Signed)
Patient advised and verbalized an understanding  

## 2017-01-29 NOTE — Telephone Encounter (Signed)
See clinical support message.

## 2017-02-21 ENCOUNTER — Other Ambulatory Visit: Payer: Self-pay | Admitting: Family Medicine

## 2017-04-10 ENCOUNTER — Ambulatory Visit: Payer: Medicare Other | Admitting: Family Medicine

## 2017-05-24 ENCOUNTER — Ambulatory Visit (INDEPENDENT_AMBULATORY_CARE_PROVIDER_SITE_OTHER): Payer: Medicare Other | Admitting: Family Medicine

## 2017-05-24 ENCOUNTER — Encounter: Payer: Self-pay | Admitting: Family Medicine

## 2017-05-24 VITALS — BP 128/80 | HR 62 | Temp 98.0°F | Resp 18 | Ht 64.0 in | Wt 129.5 lb

## 2017-05-24 DIAGNOSIS — N951 Menopausal and female climacteric states: Secondary | ICD-10-CM | POA: Insufficient documentation

## 2017-05-24 DIAGNOSIS — M8580 Other specified disorders of bone density and structure, unspecified site: Secondary | ICD-10-CM | POA: Diagnosis not present

## 2017-05-24 DIAGNOSIS — K219 Gastro-esophageal reflux disease without esophagitis: Secondary | ICD-10-CM | POA: Diagnosis not present

## 2017-05-24 DIAGNOSIS — E119 Type 2 diabetes mellitus without complications: Secondary | ICD-10-CM

## 2017-05-24 DIAGNOSIS — E785 Hyperlipidemia, unspecified: Secondary | ICD-10-CM

## 2017-05-24 DIAGNOSIS — I10 Essential (primary) hypertension: Secondary | ICD-10-CM

## 2017-05-24 LAB — POCT GLYCOSYLATED HEMOGLOBIN (HGB A1C): Hemoglobin A1C: 6.2

## 2017-05-24 NOTE — Assessment & Plan Note (Signed)
Well controlled. Continue current medication.  

## 2017-05-24 NOTE — Progress Notes (Signed)
  Tommi Rumps, MD Phone: [phone]  Dominique James is a 66 y.o. female who presents today for follow-up.  HYPERTENSION  Disease Monitoring  Home BP Monitoring not checking Chest pain- no    Dyspnea- no Medications  Compliance-  Taking atenolol, quinapril.  Edema- no  Diabetes: Not checking sugars. Not on medication. No polyuria or polydipsia. Not exercising much so she is going to join [employer] sneakers. She's off of her paleo diet.  Patient does note occasional postmenopausal hot flashes though these do not occur that often. They're not that bothersome.  History of osteopenia: Was on Fosamax though had significant muscle aches and bone aches with this. Did not tolerate this. She does not want to have another bone density test.  GERD: Taking zegrid. Asymptomatic. No abdominal pain or blood in her stool.   PMH: nonsmoker.   ROS see history of present illness  Objective  Physical Exam Vitals:   05/24/17 0843  BP: 128/80  Pulse: 62  Resp: 18  Temp: 98 F (36.7 C)    BP Readings from Last 3 Encounters:  05/24/17 128/80  01/23/17 128/70  01/08/17 138/80   Wt Readings from Last 3 Encounters:  05/24/17 129 lb 8 oz (58.7 kg)  01/08/17 131 lb 12.8 oz (59.8 kg)  10/09/16 135 lb (61.2 kg)    Physical Exam  Constitutional: No distress.  Cardiovascular: Normal rate, regular rhythm and normal heart sounds.   Pulmonary/Chest: Effort normal and breath sounds normal.  Abdominal: Soft. Bowel sounds are normal. She exhibits no distension. There is no tenderness. There is no rebound and no guarding.  Musculoskeletal: She exhibits no edema.  Neurological: She is alert. Gait normal.  Skin: Skin is warm and dry. She is not diaphoretic.     Assessment/Plan: Please see individual problem list.  G E R D Well-controlled. Continue current medication.  HYPERTENSION, BENIGN ESSENTIAL At goal. Continue current medication.  Type 2 diabetes mellitus without complication,  without long-term current use of insulin (HCC) Check A1c. Continue diet and exercise.  Osteopenia History of this. Encouraged calcium and vitamin D intake. Patient declines a follow-up DEXA scan.  Hot flashes due to menopause Rare hot flashes related to menopause. If they become more bothersome to the patient she'll let us know.   Orders Placed This Encounter  Procedures  . POCT HgB A1C    Tommi Rumps, MD Dominique James

## 2017-05-24 NOTE — Patient Instructions (Signed)
Nice to see you. Please continue to work on diet and exercise. Please take calcium and vitamin D supplementation over-the-counter.

## 2017-05-24 NOTE — Assessment & Plan Note (Signed)
Rare hot flashes related to menopause. If they become more bothersome to the patient she'll let us know.

## 2017-05-24 NOTE — Assessment & Plan Note (Signed)
Check A1c.  Continue diet and exercise. 

## 2017-05-24 NOTE — Assessment & Plan Note (Signed)
At goal. Continue current medication. 

## 2017-05-24 NOTE — Assessment & Plan Note (Signed)
History of this. Encouraged calcium and vitamin D intake. Patient declines a follow-up DEXA scan.

## 2017-06-29 ENCOUNTER — Other Ambulatory Visit: Payer: Self-pay | Admitting: Family Medicine

## 2017-08-25 ENCOUNTER — Other Ambulatory Visit: Payer: Self-pay | Admitting: Family Medicine

## 2017-08-27 ENCOUNTER — Encounter: Payer: Self-pay | Admitting: Family Medicine

## 2017-08-27 ENCOUNTER — Other Ambulatory Visit: Payer: Self-pay | Admitting: Family Medicine

## 2017-08-27 ENCOUNTER — Other Ambulatory Visit (INDEPENDENT_AMBULATORY_CARE_PROVIDER_SITE_OTHER): Payer: Medicare Other

## 2017-08-27 DIAGNOSIS — I10 Essential (primary) hypertension: Secondary | ICD-10-CM | POA: Diagnosis not present

## 2017-08-27 DIAGNOSIS — E785 Hyperlipidemia, unspecified: Secondary | ICD-10-CM

## 2017-08-27 LAB — LIPID PANEL
CHOL/HDL RATIO: 6
Cholesterol: 199 mg/dL (ref 0–200)
HDL: 32 mg/dL — AB (ref >39.00)
LDL Cholesterol: 136 mg/dL — ABNORMAL HIGH (ref 0–99)
NONHDL: 166.52
Triglycerides: 155 mg/dL — ABNORMAL HIGH (ref 0.0–149.0)
VLDL: 31 mg/dL (ref 0.0–40.0)

## 2017-08-27 LAB — COMPREHENSIVE METABOLIC PANEL
ALT: 15 U/L (ref 0–35)
AST: 17 U/L (ref 0–37)
Albumin: 4.1 g/dL (ref 3.5–5.2)
Alkaline Phosphatase: 54 U/L (ref 39–117)
BILIRUBIN TOTAL: 0.5 mg/dL (ref 0.2–1.2)
BUN: 20 mg/dL (ref 6–23)
CO2: 26 meq/L (ref 19–32)
CREATININE: 0.99 mg/dL (ref 0.40–1.20)
Calcium: 9.6 mg/dL (ref 8.4–10.5)
Chloride: 103 mEq/L (ref 96–112)
GFR: 59.32 mL/min — AB (ref >60.00)
GLUCOSE: 127 mg/dL — AB (ref 70–99)
Potassium: 4.2 mEq/L (ref 3.5–5.1)
SODIUM: 138 meq/L (ref 135–145)
TOTAL PROTEIN: 7.1 g/dL (ref 6.0–8.3)

## 2017-08-27 MED ORDER — OMEPRAZOLE-SODIUM BICARBONATE 20-1100 MG PO CAPS: ORAL_CAPSULE | ORAL | 3 refills | Status: DC

## 2017-11-26 ENCOUNTER — Encounter: Payer: Self-pay | Admitting: Family Medicine

## 2017-11-26 ENCOUNTER — Other Ambulatory Visit: Payer: Self-pay

## 2017-11-26 ENCOUNTER — Ambulatory Visit: Payer: Medicare Other | Admitting: Family Medicine

## 2017-11-26 VITALS — BP 144/90 | HR 70 | Temp 98.2°F | Wt 135.2 lb

## 2017-11-26 DIAGNOSIS — E119 Type 2 diabetes mellitus without complications: Secondary | ICD-10-CM | POA: Diagnosis not present

## 2017-11-26 DIAGNOSIS — Z1211 Encounter for screening for malignant neoplasm of colon: Secondary | ICD-10-CM | POA: Diagnosis not present

## 2017-11-26 DIAGNOSIS — E785 Hyperlipidemia, unspecified: Secondary | ICD-10-CM

## 2017-11-26 DIAGNOSIS — I10 Essential (primary) hypertension: Secondary | ICD-10-CM

## 2017-11-26 DIAGNOSIS — Z1239 Encounter for other screening for malignant neoplasm of breast: Secondary | ICD-10-CM

## 2017-11-26 DIAGNOSIS — Z1231 Encounter for screening mammogram for malignant neoplasm of breast: Secondary | ICD-10-CM | POA: Diagnosis not present

## 2017-11-26 LAB — COMPREHENSIVE METABOLIC PANEL
ALBUMIN: 4.2 g/dL (ref 3.5–5.2)
ALT: 24 U/L (ref 0–35)
AST: 25 U/L (ref 0–37)
Alkaline Phosphatase: 58 U/L (ref 39–117)
BILIRUBIN TOTAL: 0.5 mg/dL (ref 0.2–1.2)
BUN: 20 mg/dL (ref 6–23)
CALCIUM: 9.3 mg/dL (ref 8.4–10.5)
CHLORIDE: 105 meq/L (ref 96–112)
CO2: 26 mEq/L (ref 19–32)
CREATININE: 1.02 mg/dL (ref 0.40–1.20)
GFR: 57.27 mL/min — ABNORMAL LOW (ref >60.00)
Glucose, Bld: 120 mg/dL — ABNORMAL HIGH (ref 70–99)
Potassium: 4.2 mEq/L (ref 3.5–5.1)
SODIUM: 141 meq/L (ref 135–145)
Total Protein: 7.3 g/dL (ref 6.0–8.3)

## 2017-11-26 LAB — CK: CK TOTAL: 63 U/L (ref 7–177)

## 2017-11-26 LAB — HEMOGLOBIN A1C: Hgb A1c MFr Bld: 6.7 % — ABNORMAL HIGH (ref 4.6–6.5)

## 2017-11-26 LAB — LDL CHOLESTEROL, DIRECT: LDL DIRECT: 120 mg/dL

## 2017-11-26 NOTE — Assessment & Plan Note (Signed)
Above goal though has been well controlled previously.  We will have her check her blood pressure daily for the next week and then return for a blood pressure check with nursing.  Consider additional medication if still elevated.

## 2017-11-26 NOTE — Assessment & Plan Note (Addendum)
Patient declines colonoscopy.  Prior colonoscopy with an adenomatous polyp.  Does not meet criteria for cologuard.  She has been doing FOBT cards since her colonoscopy.  She wants to proceed with FOBT cards.  These were provided to her.

## 2017-11-26 NOTE — Progress Notes (Signed)
Tommi Rumps, MD Phone: [phone]  Dominique James is a 67 y.o. female who presents today for follow-up.  Hypertension: Not checking at home.  Taking atenolol and quinapril.  No chest pain, shortness of breath, or edema.  Hyperlipidemia: Saw cardiology and they placed her on Zetia and niacin.  She has had some slight myalgias in her legs with this.  They are tolerable.  No right upper quadrant pain.  Diabetes: She has gotten away from diet and exercise as her husband has been sick recently.  He is being worked up through oncology.  She notes no polyuria or polydipsia.  Social History   Tobacco Use  Smoking Status Never Smoker  Smokeless Tobacco Never Used     ROS see history of present illness  Objective  Physical Exam Vitals:   11/26/17 0842  BP: (!) 144/90  Pulse: 70  Temp: 98.2 F (36.8 C)  SpO2: 95%    BP Readings from Last 3 Encounters:  11/26/17 (!) 144/90  05/24/17 128/80  01/23/17 128/70   Wt Readings from Last 3 Encounters:  11/26/17 135 lb 3.2 oz (61.3 kg)  05/24/17 129 lb 8 oz (58.7 kg)  01/08/17 131 lb 12.8 oz (59.8 kg)    Physical Exam  Constitutional: No distress.  Cardiovascular: Normal rate, regular rhythm and normal heart sounds.  Pulmonary/Chest: Effort normal and breath sounds normal.  Musculoskeletal: She exhibits no edema.  Neurological: She is alert. Gait normal.  Skin: Skin is warm and dry. She is not diaphoretic.   Diabetic Foot Exam - Simple   Simple Foot Form Diabetic Foot exam was performed with the following findings:  Yes 11/26/2017  9:21 AM  Visual Inspection No deformities, no ulcerations, no other skin breakdown bilaterally:  Yes Sensation Testing Intact to touch and monofilament testing bilaterally:  Yes Pulse Check Posterior Tibialis and Dorsalis pulse intact bilaterally:  Yes Comments      Assessment/Plan: Please see individual problem list.  HYPERTENSION, BENIGN ESSENTIAL Above goal though has been well  controlled previously.  We will have her check her blood pressure daily for the next week and then return for a blood pressure check with nursing.  Consider additional medication if still elevated.  Type 2 diabetes mellitus without complication, without long-term current use of insulin ([place]) Has been well controlled.  Will check an A1c.  Encourage diet and exercise.  HLD (hyperlipidemia) Now on Zetia.  Also on niacin.  Will check LDL.  With her myalgias we will check a CK.  Encouraged co-Q10 use to possibly help with her myalgias.  Colon cancer screening Patient declines colonoscopy.  Prior colonoscopy with an adenomatous polyp.  Does not meet criteria for cologuard.  She has been doing FOBT cards since her colonoscopy.  She wants to proceed with FOBT cards.  These were provided to her.  Breast cancer screening Mammogram ordered.  Patient wants to set this up on her own.   Orders Placed This Encounter  Procedures  . Fecal occult blood, imunochemical    Standing Status:   Future    Standing Expiration Date:   11/26/2018  . MM SCREENING BREAST TOMO BILATERAL    Standing Status:   Future    Standing Expiration Date:   01/25/2019    Order Specific Question:   Reason for Exam (SYMPTOM  OR DIAGNOSIS REQUIRED)    Answer:   breast cancer screening    Order Specific Question:   Preferred imaging location?    Answer:   [facility]  .  Comp Met (CMET)  . CK (Creatine Kinase)  . Direct LDL  . HgB A1c    No orders of the defined types were placed in this encounter.    Tommi Rumps, MD [place]

## 2017-11-26 NOTE — Patient Instructions (Signed)
Nice to see you. We will check lab work today. Please work on diet and exercise.

## 2017-11-26 NOTE — Assessment & Plan Note (Signed)
Has been well controlled.  Will check an A1c.  Encourage diet and exercise.

## 2017-11-26 NOTE — Assessment & Plan Note (Signed)
Now on Zetia.  Also on niacin.  Will check LDL.  With her myalgias we will check a CK.  Encouraged co-Q10 use to possibly help with her myalgias.

## 2017-11-26 NOTE — Assessment & Plan Note (Signed)
Mammogram ordered.  Patient wants to set this up on her own.

## 2017-12-04 ENCOUNTER — Ambulatory Visit: Payer: Medicare Other

## 2017-12-05 ENCOUNTER — Encounter: Payer: Self-pay | Admitting: *Deleted

## 2017-12-05 ENCOUNTER — Ambulatory Visit (INDEPENDENT_AMBULATORY_CARE_PROVIDER_SITE_OTHER): Payer: Medicare Other | Admitting: *Deleted

## 2017-12-05 ENCOUNTER — Other Ambulatory Visit: Payer: Medicare Other

## 2017-12-05 VITALS — BP 138/86 | HR 69 | Resp 17

## 2017-12-05 DIAGNOSIS — I10 Essential (primary) hypertension: Secondary | ICD-10-CM

## 2017-12-05 NOTE — Progress Notes (Unsigned)
ifob

## 2017-12-05 NOTE — Progress Notes (Signed)
Patient came into clinic for one week follow up on BP, with blood pressure log from home reading . Reading clinic taken in left arm 138/86 pulse 69.

## 2017-12-06 ENCOUNTER — Other Ambulatory Visit (INDEPENDENT_AMBULATORY_CARE_PROVIDER_SITE_OTHER): Payer: Medicare Other

## 2017-12-06 DIAGNOSIS — Z1211 Encounter for screening for malignant neoplasm of colon: Secondary | ICD-10-CM | POA: Diagnosis not present

## 2017-12-06 LAB — FECAL OCCULT BLOOD, IMMUNOCHEMICAL: Fecal Occult Bld: NEGATIVE

## 2017-12-13 NOTE — Progress Notes (Signed)
At goal at home.  BPs will be scanned into chart.  Continue current medications.

## 2017-12-14 NOTE — Progress Notes (Signed)
Patient notified and voiced understanding.

## 2017-12-26 ENCOUNTER — Other Ambulatory Visit: Payer: Self-pay | Admitting: Family Medicine

## 2018-01-01 ENCOUNTER — Other Ambulatory Visit: Payer: Self-pay | Admitting: Family Medicine

## 2018-01-02 ENCOUNTER — Telehealth: Payer: Self-pay

## 2018-01-02 NOTE — Telephone Encounter (Signed)
Patients pharmacy is requesting an alternative rx for Zegerid, this rx is not covered by patients insurance

## 2018-01-02 NOTE — Telephone Encounter (Signed)
Omeprazole sent to pharmacy. Please contact the patient and let her know that her insurance is not going to cover zegrid. Thanks.

## 2018-01-02 NOTE — Telephone Encounter (Signed)
Patient notified

## 2018-01-18 ENCOUNTER — Ambulatory Visit
Admission: RE | Admit: 2018-01-18 | Discharge: 2018-01-18 | Disposition: A | Discharge status: Home / Self Care | Payer: Medicare Other | Source: Ambulatory Visit | Attending: Family Medicine | Admitting: Family Medicine

## 2018-01-18 DIAGNOSIS — Z1239 Encounter for other screening for malignant neoplasm of breast: Secondary | ICD-10-CM

## 2018-01-18 DIAGNOSIS — Z1231 Encounter for screening mammogram for malignant neoplasm of breast: Secondary | ICD-10-CM | POA: Insufficient documentation

## 2018-02-11 ENCOUNTER — Other Ambulatory Visit: Payer: Self-pay | Admitting: Family Medicine

## 2018-05-27 ENCOUNTER — Ambulatory Visit: Payer: Medicare Other | Admitting: Family Medicine

## 2018-05-28 ENCOUNTER — Encounter: Payer: Self-pay | Admitting: Family Medicine

## 2018-05-28 ENCOUNTER — Ambulatory Visit: Payer: Medicare Other | Admitting: Family Medicine

## 2018-05-28 VITALS — BP 120/82 | HR 73 | Temp 97.9°F | Ht 64.0 in | Wt 129.6 lb

## 2018-05-28 DIAGNOSIS — I10 Essential (primary) hypertension: Secondary | ICD-10-CM

## 2018-05-28 DIAGNOSIS — E785 Hyperlipidemia, unspecified: Secondary | ICD-10-CM | POA: Diagnosis not present

## 2018-05-28 DIAGNOSIS — Z1212 Encounter for screening for malignant neoplasm of rectum: Secondary | ICD-10-CM

## 2018-05-28 DIAGNOSIS — Z1211 Encounter for screening for malignant neoplasm of colon: Secondary | ICD-10-CM | POA: Diagnosis not present

## 2018-05-28 DIAGNOSIS — E119 Type 2 diabetes mellitus without complications: Secondary | ICD-10-CM | POA: Diagnosis not present

## 2018-05-28 LAB — BASIC METABOLIC PANEL
BUN: 24 mg/dL — AB (ref 6–23)
CALCIUM: 9.9 mg/dL (ref 8.4–10.5)
CO2: 27 meq/L (ref 19–32)
Chloride: 102 mEq/L (ref 96–112)
Creatinine, Ser: 1.15 mg/dL (ref 0.40–1.20)
GFR: 49.79 mL/min — AB (ref >60.00)
GLUCOSE: 79 mg/dL (ref 70–99)
Potassium: 4.4 mEq/L (ref 3.5–5.1)
Sodium: 138 mEq/L (ref 135–145)

## 2018-05-28 LAB — HEMOGLOBIN A1C: Hgb A1c MFr Bld: 6.6 % — ABNORMAL HIGH (ref 4.6–6.5)

## 2018-05-28 LAB — LDL CHOLESTEROL, DIRECT: Direct LDL: 148 mg/dL

## 2018-05-28 NOTE — Assessment & Plan Note (Signed)
Check A1c.  Continue to work on activity.

## 2018-05-28 NOTE — Assessment & Plan Note (Signed)
Continue niacin.  Check LDL.

## 2018-05-28 NOTE — Patient Instructions (Signed)
Nice to see you. We will check lab work today and contact you with the results.  Please stay active.

## 2018-05-28 NOTE — Assessment & Plan Note (Signed)
Blood pressures typically in the 106/62 range when she takes her medications.  Encouraged to consistently take medications.  She will continue to monitor blood pressures.  Continue current regimen.

## 2018-05-28 NOTE — Progress Notes (Signed)
  Tommi Rumps, MD Phone: [phone]  Dominique James is a 67 y.o. female who presents today for f/u.  CC: htn, hld, dm  HYPERTENSION  Disease Monitoring  Home BP Monitoring 106-135/62-85 Chest pain- no    Dyspnea- no Medications  Compliance-  Taking quinapril, atenolol.  Edema- no  Hyperlipidemia: She could not tolerate Zetia related to diarrhea.  She is taking niacin.  No right upper quadrant pain or myalgias.  Diabetes: No polyuria or polydipsia.  Her husband ended up having a bone marrow transplant so she has not been able to watch her diet very much.  She has been doing a lot of outside work and staying active.    Social History   Tobacco Use  Smoking Status Never Smoker  Smokeless Tobacco Never Used     ROS see history of present illness  Objective  Physical Exam Vitals:   05/28/18 1403  BP: 120/82  Pulse: 73  Temp: 97.9 F (36.6 C)  SpO2: 97%    BP Readings from Last 3 Encounters:  05/28/18 120/82  12/05/17 138/86  11/26/17 (!) 144/90   Wt Readings from Last 3 Encounters:  05/28/18 129 lb 9.6 oz (58.8 kg)  11/26/17 135 lb 3.2 oz (61.3 kg)  05/24/17 129 lb 8 oz (58.7 kg)    Physical Exam  Constitutional: No distress.  Cardiovascular: Normal rate, regular rhythm and normal heart sounds.  Pulmonary/Chest: Effort normal and breath sounds normal.  Musculoskeletal: She exhibits no edema.  Neurological: She is alert.  Skin: Skin is warm and dry. She is not diaphoretic.     Assessment/Plan: Please see individual problem list.  HYPERTENSION, BENIGN ESSENTIAL Blood pressures typically in the 106/62 range when she takes her medications.  Encouraged to consistently take medications.  She will continue to monitor blood pressures.  Continue current regimen.  Type 2 diabetes mellitus without complication, without long-term current use of insulin (HCC) Check A1c.  Continue to work on activity.  HLD (hyperlipidemia) Continue niacin.  Check  LDL.   Orders Placed This Encounter  Procedures  . Cologuard  . Basic Metabolic Panel (BMET)  . HgB A1c  . Direct LDL    No orders of the defined types were placed in this encounter.    Tommi Rumps, MD [place]

## 2018-05-29 ENCOUNTER — Encounter: Payer: Self-pay | Admitting: Family Medicine

## 2018-06-03 ENCOUNTER — Other Ambulatory Visit: Payer: Self-pay | Admitting: Family Medicine

## 2018-06-12 ENCOUNTER — Telehealth: Payer: Self-pay | Admitting: Family Medicine

## 2018-06-12 NOTE — Telephone Encounter (Signed)
Pt declined 2019 AWV

## 2018-06-15 ENCOUNTER — Other Ambulatory Visit: Payer: Self-pay | Admitting: Family Medicine

## 2018-07-16 LAB — COLOGUARD: COLOGUARD: NEGATIVE

## 2018-12-15 ENCOUNTER — Other Ambulatory Visit: Payer: Self-pay | Admitting: Family Medicine

## 2019-01-16 ENCOUNTER — Other Ambulatory Visit: Payer: Self-pay | Admitting: Family Medicine

## 2019-02-12 ENCOUNTER — Other Ambulatory Visit: Payer: Self-pay | Admitting: Family Medicine

## 2019-02-21 ENCOUNTER — Other Ambulatory Visit: Payer: Self-pay | Admitting: Family Medicine

## 2019-03-06 ENCOUNTER — Other Ambulatory Visit: Payer: Self-pay | Admitting: Family Medicine

## 2019-04-08 ENCOUNTER — Ambulatory Visit (INDEPENDENT_AMBULATORY_CARE_PROVIDER_SITE_OTHER): Payer: Medicare Other | Admitting: Family Medicine

## 2019-04-08 ENCOUNTER — Other Ambulatory Visit: Payer: Self-pay

## 2019-04-08 ENCOUNTER — Encounter: Payer: Self-pay | Admitting: Family Medicine

## 2019-04-08 DIAGNOSIS — Z1239 Encounter for other screening for malignant neoplasm of breast: Secondary | ICD-10-CM

## 2019-04-08 DIAGNOSIS — K219 Gastro-esophageal reflux disease without esophagitis: Secondary | ICD-10-CM | POA: Diagnosis not present

## 2019-04-08 DIAGNOSIS — I10 Essential (primary) hypertension: Secondary | ICD-10-CM | POA: Diagnosis not present

## 2019-04-08 DIAGNOSIS — E119 Type 2 diabetes mellitus without complications: Secondary | ICD-10-CM | POA: Diagnosis not present

## 2019-04-08 NOTE — Progress Notes (Signed)
Virtual Visit via video Note  This visit type was conducted due to national recommendations for restrictions regarding the COVID-19 pandemic (e.g. social distancing).  This format is felt to be most appropriate for this patient at this time.  All issues noted in this document were discussed and addressed.  No physical exam was performed (except for noted visual exam findings with Video Visits).   I connected with Shirlean Kelly today at  1:15 PM EDT by a video enabled telemedicine application and verified that I am speaking with the correct person using two identifiers. Location patient: home Location provider: work Persons participating in the virtual visit: patient, provider, Kierstan Auer (husband)  I discussed the limitations, risks, security and privacy concerns of performing an evaluation and management service by telephone and the availability of in person appointments. I also discussed with the patient that there may be a patient responsible charge related to this service. The patient expressed understanding and agreed to proceed.   Reason for visit: follow-up  HPI: HYPERTENSION  Disease Monitoring  Home BP Monitoring not checking Chest pain- no    Dyspnea- no Medications  Compliance-  Taking atenolol, quinapril.  Edema- no  DIABETES Disease Monitoring: Blood Sugar ranges-not checking Polyuria/phagia/dipsia- no      Optho- saw recently  Not exercising.  Her husband has had a bone marrow transplant in the last year and that put a kink in things.  They eat relatively healthy though she does drink some soda and sweet tea daily.  Not many sweets or fatty foods.  GERD: Patient came off of her omeprazole.  She notes only rare symptoms when she eats spicy foods.  She will take a Pepcid and that will resolve the symptoms.  No abdominal pain, blood in her stool, or dysphagia.   ROS: See pertinent positives and negatives per HPI.  Past Medical History:  Diagnosis Date  . Allergy   .  Hypertriglyceridemia 11/09/2011  . Myocardial infarction [facility])     Past Surgical History:  Procedure Laterality Date  . BREAST CYST ASPIRATION     left  . CHOLECYSTECTOMY    . endocervical polyp  06/1998   removed  . GANGLION CYST EXCISION  02/2007   left knee    Family History  Problem Relation Age of Onset  . Cancer Father 75       throat  . Cancer Mother        colon  . Diabetes Mother   . Hypertension Mother   . Fibromyalgia Sister   . Heart disease Brother   . Alcohol abuse Son   . Drug abuse Son   . Cancer Maternal Grandmother        brest  . Breast cancer Maternal Grandmother        late 24's early 15's  . Anemia Brother     SOCIAL HX: Non-smoker.   Current Outpatient Medications:  .  atenolol (TENORMIN) 50 MG tablet, TAKE 1 TABLET BY MOUTH EVERY DAY, Disp: 30 tablet, Rfl: 0 .  Choline Fenofibrate (FENOFIBRIC ACID) 135 MG CPDR, TAKE 1 CAPSULE (135 MG TOTAL) BY MOUTH DAILY., Disp: 90 capsule, Rfl: 3 .  ergocalciferol (VITAMIN D2) 1.25 MG (50000 UT) capsule, Vitamin D2 1,250 mcg (50,000 unit) capsule, Disp: , Rfl:  .  niacin 500 MG tablet, Take 500 mg by mouth daily., Disp: , Rfl:  .  quinapril (ACCUPRIL) 10 MG tablet, TAKE 1 TABLET (10 MG TOTAL) BY MOUTH DAILY., Disp: 90 tablet, Rfl: 3 .  Omeprazole-Sodium Bicarbonate (ZEGERID) 20-1100 MG CAPS capsule, TAKE 1 CAPSULE BY MOUTH EVERY DAY BEFORE BREAKFAST (Patient not taking: Reported on 04/08/2019), Disp: 30 capsule, Rfl: 3  EXAM:  VITALS per patient if applicable: None.  GENERAL: alert, oriented, appears well and in no acute distress  HEENT: atraumatic, conjunttiva clear, no obvious abnormalities on inspection of external nose and ears  NECK: normal movements of the head and neck  LUNGS: on inspection no signs of respiratory distress, breathing rate appears normal, no obvious gross SOB, gasping or wheezing  CV: no obvious cyanosis  MS: moves all visible extremities without noticeable abnormality   PSYCH/NEURO: pleasant and cooperative, no obvious depression or anxiety, speech and thought processing grossly intact  ASSESSMENT AND PLAN:  Discussed the following assessment and plan:  Breast cancer screening - Plan: MM 3D SCREEN BREAST BILATERAL  Type 2 diabetes mellitus without complication, without long-term current use of insulin (HCC) - Plan: Hemoglobin A1c  Gastroesophageal reflux disease, esophagitis presence not specified  HYPERTENSION, BENIGN ESSENTIAL - Plan: Lipid panel, Comp Met (CMET)  Type 2 diabetes mellitus without complication, without long-term current use of insulin (HCC) Check A1c.  Encouraged adding exercise and and monitoring her diet.  Discussed decreasing sugary drink intake.  G E R D Rare symptoms off of omeprazole.  She will continue to monitor and work on dietary changes with decreasing spicy foods and acidic drinks.  Discussed that if her symptoms do worsen she should let us know though she could also increase her Pepcid to daily.  Continue as needed Pepcid.  HYPERTENSION, BENIGN ESSENTIAL Previously well controlled.  We will have her in for lab work.  She will continue her current regimen.  Patient will call the breast center to schedule her mammogram.  We will request her ophthalmology records the next time she is in the office.  I discussed having her go to the pharmacy for pneumonia vaccine though she wished to have this done here at her next visit.  [place] office will contact the patient to schedule her for follow-up in 4 months.  Social distancing precautions and sick precautions discussed regarding COVID-19.   I discussed the assessment and treatment plan with the patient. The patient was provided an opportunity to ask questions and all were answered. The patient agreed with the plan and demonstrated an understanding of the instructions.   The patient was advised to call back or seek an in-person evaluation if the symptoms worsen or if the condition  fails to improve as anticipated.   Tommi Rumps, MD

## 2019-04-08 NOTE — Assessment & Plan Note (Signed)
Previously well controlled.  We will have her in for lab work.  She will continue her current regimen.

## 2019-04-08 NOTE — Assessment & Plan Note (Signed)
Rare symptoms off of omeprazole.  She will continue to monitor and work on dietary changes with decreasing spicy foods and acidic drinks.  Discussed that if her symptoms do worsen she should let us know though she could also increase her Pepcid to daily.  Continue as needed Pepcid.

## 2019-04-08 NOTE — Assessment & Plan Note (Signed)
Check A1c.  Encouraged adding exercise and and monitoring her diet.  Discussed decreasing sugary drink intake.

## 2019-04-22 ENCOUNTER — Encounter: Payer: Self-pay | Admitting: Family Medicine

## 2019-05-13 LAB — HEMOGLOBIN A1C: Hemoglobin A1C: 7.3

## 2019-05-19 ENCOUNTER — Other Ambulatory Visit: Payer: Self-pay | Admitting: Family Medicine

## 2019-06-03 ENCOUNTER — Telehealth: Payer: Self-pay | Admitting: Family Medicine

## 2019-06-03 NOTE — Telephone Encounter (Signed)
Please let the patient know that I received a copy of an A1c report that was collected in June.  I would like to start her on medication for this.  If she is willing I can send this to her pharmacy.

## 2019-06-05 NOTE — Telephone Encounter (Signed)
Called patient and phone just rang no voicemail, pec ok to advise.  Nina,cma

## 2019-06-11 ENCOUNTER — Ambulatory Visit
Admission: RE | Admit: 2019-06-11 | Discharge: 2019-06-11 | Disposition: A | Discharge status: Home / Self Care | Payer: Medicare Other | Source: Ambulatory Visit | Attending: Family Medicine | Admitting: Family Medicine

## 2019-06-11 DIAGNOSIS — Z1239 Encounter for other screening for malignant neoplasm of breast: Secondary | ICD-10-CM

## 2019-06-11 DIAGNOSIS — Z1231 Encounter for screening mammogram for malignant neoplasm of breast: Secondary | ICD-10-CM | POA: Insufficient documentation

## 2019-06-13 ENCOUNTER — Other Ambulatory Visit: Payer: Self-pay | Admitting: Family Medicine

## 2019-11-07 ENCOUNTER — Other Ambulatory Visit: Payer: Self-pay | Admitting: Family Medicine

## 2019-11-07 NOTE — Telephone Encounter (Signed)
1 refill sent in.  Patient needs to follow-up for further refills.  Please get her scheduled.  Thanks.

## 2019-11-18 NOTE — Telephone Encounter (Signed)
I called to schedule the patient an f/up appointment with the provider for future prescriptions, the phone did not have  a voicemail.  Dominique James,cma

## 2020-02-02 ENCOUNTER — Other Ambulatory Visit: Payer: Self-pay | Admitting: Family Medicine

## 2020-02-12 ENCOUNTER — Other Ambulatory Visit: Payer: Self-pay | Admitting: Family Medicine

## 2020-02-20 ENCOUNTER — Ambulatory Visit (INDEPENDENT_AMBULATORY_CARE_PROVIDER_SITE_OTHER): Payer: Medicare PPO | Admitting: Family Medicine

## 2020-02-20 ENCOUNTER — Encounter: Payer: Self-pay | Admitting: Family Medicine

## 2020-02-20 ENCOUNTER — Other Ambulatory Visit: Payer: Self-pay

## 2020-02-20 VITALS — BP 120/70 | HR 76 | Temp 97.7°F | Ht 65.0 in | Wt 126.8 lb

## 2020-02-20 DIAGNOSIS — E119 Type 2 diabetes mellitus without complications: Secondary | ICD-10-CM

## 2020-02-20 DIAGNOSIS — E785 Hyperlipidemia, unspecified: Secondary | ICD-10-CM | POA: Diagnosis not present

## 2020-02-20 DIAGNOSIS — Z0000 Encounter for general adult medical examination without abnormal findings: Secondary | ICD-10-CM

## 2020-02-20 DIAGNOSIS — E559 Vitamin D deficiency, unspecified: Secondary | ICD-10-CM

## 2020-02-20 LAB — LIPID PANEL
Cholesterol: 203 mg/dL — ABNORMAL HIGH (ref 0–200)
HDL: 33.5 mg/dL — ABNORMAL LOW (ref >39.00)
LDL Cholesterol: 143 mg/dL — ABNORMAL HIGH (ref 0–99)
NonHDL: 169.85
Total CHOL/HDL Ratio: 6
Triglycerides: 134 mg/dL (ref 0.0–149.0)
VLDL: 26.8 mg/dL (ref 0.0–40.0)

## 2020-02-20 LAB — COMPREHENSIVE METABOLIC PANEL
ALT: 15 U/L (ref 0–35)
AST: 20 U/L (ref 0–37)
Albumin: 4.2 g/dL (ref 3.5–5.2)
Alkaline Phosphatase: 49 U/L (ref 39–117)
BUN: 16 mg/dL (ref 6–23)
CO2: 26 mEq/L (ref 19–32)
Calcium: 9.3 mg/dL (ref 8.4–10.5)
Chloride: 105 mEq/L (ref 96–112)
Creatinine, Ser: 0.97 mg/dL (ref 0.40–1.20)
GFR: 56.73 mL/min — ABNORMAL LOW (ref >60.00)
Glucose, Bld: 91 mg/dL (ref 70–99)
Potassium: 4.1 mEq/L (ref 3.5–5.1)
Sodium: 139 mEq/L (ref 135–145)
Total Bilirubin: 0.5 mg/dL (ref 0.2–1.2)
Total Protein: 7.1 g/dL (ref 6.0–8.3)

## 2020-02-20 LAB — HEMOGLOBIN A1C: Hgb A1c MFr Bld: 6.2 % (ref 4.6–6.5)

## 2020-02-20 LAB — VITAMIN D 25 HYDROXY (VIT D DEFICIENCY, FRACTURES): VITD: 24.36 ng/mL — ABNORMAL LOW (ref 30.00–100.00)

## 2020-02-20 NOTE — Progress Notes (Signed)
Dominique Rumps, MD Phone: [phone]  MANPREET KEMMER is a 69 y.o. female who presents today for CPE.  Diet: Not good though she has been avoiding sweets. Exercise: Stays busy.  Walks. Colonoscopy: Patient does Cologuard.  This is up-to-date. Mammogram: Up-to-date.  Negative. Family history: Maternal grandmother with breast cancer.  No family history of ovarian cancer or colon cancer. Menses: Postmenopausal.  No bleeding. Vaccines-   Flu: Out of season.  Tetanus: Declines.  Shingles: Declines.  COVID19: He declines  Pneumonia:.  Declines. Hep C Screening: Up-to-date. Tobacco use: No. Alcohol use: Rare. Illicit Drug use: No. Dentist: Yes Ophthalmology:.  Yes.   Active Ambulatory Problems    Diagnosis Date Noted  . Vitamin D deficiency 04/01/2009  . HYPERTENSION, BENIGN ESSENTIAL 03/02/2009  . MYOCARDIAL INFARCTION, HX OF 03/10/2009  . ALLERGIC RHINITIS 03/10/2009  . G E R D 07/09/2007  . Osteopenia 08/23/2000  . Diarrhea 07/09/2007  . FIBROCYSTIC BREAST DISEASE, HX OF 03/10/2009  . Routine general medical examination at a health care facility 11/09/2011  . HLD (hyperlipidemia) 11/09/2011  . Epidermal cyst 11/29/2015  . Ganglion cyst 02/28/2016  . Type 2 diabetes mellitus without complication, without long-term current use of insulin ([place]) 10/09/2016  . Hot flashes due to menopause 05/24/2017  . Breast cancer screening 11/26/2017  . Colon cancer screening 11/26/2017   Resolved Ambulatory Problems    Diagnosis Date Noted  . Pure hyperglyceridemia 03/10/2009  . LOW HDL 04/01/2009  . Ganglion of joint 06/23/2009  . EMESIS 07/09/2007   Past Medical History:  Diagnosis Date  . Allergy   . Hypertriglyceridemia 11/09/2011  . Myocardial infarction [facility])     Family History  Problem Relation Age of Onset  . Cancer Father 75       throat  . Cancer Mother        colon  . Diabetes Mother   . Hypertension Mother   . Fibromyalgia Sister   . Heart disease Brother    . Alcohol abuse Son   . Drug abuse Son   . Cancer Maternal Grandmother        brest  . Breast cancer Maternal Grandmother        late 91's early 32's  . Anemia Brother     Social History   Socioeconomic History  . Marital status: Married    Spouse name: Not on file  . Number of children: 2  . Years of education: Not on file  . Highest education level: Not on file  Occupational History  . Occupation: Retired from [employer]  Tobacco Use  . Smoking status: Never Smoker  . Smokeless tobacco: Never Used  Substance and Sexual Activity  . Alcohol use: Not on file  . Drug use: Not on file  . Sexual activity: Not on file  Other Topics Concern  . Not on file  Social History Narrative  . Not on file   Social Determinants of Health   Financial Resource Strain:   . Difficulty of Paying Living Expenses:   Food Insecurity:   . Worried About Charity fundraiser in the Last Year:   . Arboriculturist in the Last Year:   Transportation Needs:   . Film/video editor (Medical):   Marland Kitchen Lack of Transportation (Non-Medical):   Physical Activity:   . Days of Exercise per Week:   . Minutes of Exercise per Session:   Stress:   . Feeling of Stress :   Social Connections:   .  Frequency of Communication with Friends and Family:   . Frequency of Social Gatherings with Friends and Family:   . Attends Religious Services:   . Active Member of Clubs or Organizations:   . Attends Archivist Meetings:   Marland Kitchen Marital Status:   Intimate Partner Violence:   . Fear of Current or Ex-Partner:   . Emotionally Abused:   Marland Kitchen Physically Abused:   . Sexually Abused:     ROS  General:  Negative for nexplained weight loss, fever Skin: Negative for new or changing mole, sore that won't heal HEENT: Negative for trouble hearing, trouble seeing, ringing in ears, mouth sores, hoarseness, change in voice, dysphagia. CV:  Negative for chest pain, dyspnea, edema, palpitations Resp: Negative for  cough, dyspnea, hemoptysis GI: Negative for nausea, vomiting, diarrhea, constipation, abdominal pain, melena, hematochezia. GU: Negative for dysuria, incontinence, urinary hesitance, hematuria, vaginal or penile discharge, polyuria, sexual difficulty, lumps in testicle or breasts MSK: Negative for muscle cramps or aches, joint pain or swelling Neuro: Negative for headaches, weakness, numbness, dizziness, passing out/fainting Psych: Negative for depression, anxiety, memory problems  Objective  Physical Exam Vitals:   02/20/20 1348  BP: 120/70  Pulse: 76  Temp: 97.7 F (36.5 C)  SpO2: 98%    BP Readings from Last 3 Encounters:  02/20/20 120/70  05/28/18 120/82  12/05/17 138/86   Wt Readings from Last 3 Encounters:  02/20/20 126 lb 12.8 oz (57.5 kg)  05/28/18 129 lb 9.6 oz (58.8 kg)  11/26/17 135 lb 3.2 oz (61.3 kg)    Physical Exam Constitutional:      General: She is not in acute distress.    Appearance: She is not diaphoretic.  HENT:     Head: Normocephalic and atraumatic.  Eyes:     Conjunctiva/sclera: Conjunctivae normal.     Pupils: Pupils are equal, round, and reactive to light.  Cardiovascular:     Rate and Rhythm: Normal rate and regular rhythm.     Heart sounds: Normal heart sounds.  Pulmonary:     Effort: Pulmonary effort is normal.     Breath sounds: Normal breath sounds.  Abdominal:     General: Bowel sounds are normal. There is no distension.     Palpations: Abdomen is soft.     Tenderness: There is no abdominal tenderness. There is no guarding or rebound.  Musculoskeletal:     Right lower leg: No edema.     Left lower leg: No edema.  Lymphadenopathy:     Cervical: No cervical adenopathy.  Skin:    General: Skin is warm and dry.  Neurological:     Mental Status: She is alert.  Psychiatric:        Mood and Affect: Mood normal.      Assessment/Plan:   Routine general medical examination at a health care facility Physical exam completed.   Encouraged dietary changes and exercise.  Discussed tetanus vaccine, pneumonia vaccine, Covid vaccine, and Shingrix vaccine.  She declines all of these at this time.  Also discussed DEXA scan though she defers this currently.  Will obtain lab work as outlined below.   Orders Placed This Encounter  Procedures  . Vitamin D (25 hydroxy)  . HgB A1c  . Comp Met (CMET)  . Lipid panel    No orders of the defined types were placed in this encounter.   This visit occurred during the SARS-CoV-2 public health emergency.  Safety protocols were in place, including screening questions prior to the  visit, additional usage of staff PPE, and extensive cleaning of exam room while observing appropriate contact time as indicated for disinfecting solutions.    Dominique Rumps, MD [place]

## 2020-02-20 NOTE — Assessment & Plan Note (Signed)
Physical exam completed.  Encouraged dietary changes and exercise.  Discussed tetanus vaccine, pneumonia vaccine, Covid vaccine, and Shingrix vaccine.  She declines all of these at this time.  Also discussed DEXA scan though she defers this currently.  Will obtain lab work as outlined below.

## 2020-02-20 NOTE — Patient Instructions (Signed)
Nice to see you. Please work on dietary changes and exercise.  If you would like to consider the tetanus vaccine, shingles vaccine, COVID-19 vaccine, or pneumonia vaccine please let us know. When you are ready to do a bone density scan please let us know.

## 2020-02-23 ENCOUNTER — Telehealth: Payer: Self-pay | Admitting: Family Medicine

## 2020-02-23 NOTE — Telephone Encounter (Signed)
lvm to set up 71m diabetes f/u

## 2020-02-23 NOTE — Telephone Encounter (Signed)
Patient called to let Dr. Biagio Quint know her BP readings over the weekend. 02/22/2020  Morning; 121/60, around 12pm ; 120/74, and 7pm; 118/70. (7pm result was before taking bp medication)

## 2020-02-24 NOTE — Telephone Encounter (Signed)
BP at home is acceptable. She should periodically check this.

## 2020-04-28 ENCOUNTER — Other Ambulatory Visit: Payer: Self-pay | Admitting: Family Medicine

## 2020-06-22 ENCOUNTER — Other Ambulatory Visit: Payer: Self-pay | Admitting: Family Medicine

## 2020-06-28 ENCOUNTER — Ambulatory Visit: Payer: Medicare PPO | Admitting: Family Medicine

## 2020-10-11 ENCOUNTER — Other Ambulatory Visit: Payer: Self-pay | Admitting: Family Medicine

## 2020-12-29 ENCOUNTER — Telehealth: Payer: Self-pay | Admitting: Family Medicine

## 2020-12-29 NOTE — Telephone Encounter (Signed)
Error

## 2021-01-05 DIAGNOSIS — H2513 Age-related nuclear cataract, bilateral: Secondary | ICD-10-CM | POA: Diagnosis not present

## 2021-01-05 DIAGNOSIS — H52223 Regular astigmatism, bilateral: Secondary | ICD-10-CM | POA: Diagnosis not present

## 2021-01-05 DIAGNOSIS — H5203 Hypermetropia, bilateral: Secondary | ICD-10-CM | POA: Diagnosis not present

## 2021-01-05 DIAGNOSIS — H524 Presbyopia: Secondary | ICD-10-CM | POA: Diagnosis not present

## 2021-01-08 ENCOUNTER — Other Ambulatory Visit: Payer: Self-pay | Admitting: Family Medicine

## 2021-01-17 ENCOUNTER — Ambulatory Visit (INDEPENDENT_AMBULATORY_CARE_PROVIDER_SITE_OTHER): Payer: Medicare PPO

## 2021-01-17 VITALS — Ht 65.0 in | Wt 126.0 lb

## 2021-01-17 DIAGNOSIS — Z1231 Encounter for screening mammogram for malignant neoplasm of breast: Secondary | ICD-10-CM | POA: Diagnosis not present

## 2021-01-17 DIAGNOSIS — Z0000 Encounter for general adult medical examination without abnormal findings: Secondary | ICD-10-CM | POA: Diagnosis not present

## 2021-01-17 NOTE — Progress Notes (Signed)
Subjective:   Dominique James is a 70 y.o. female who presents for an Initial Medicare Annual Wellness Visit.  Review of Systems    No ROS.  Medicare Wellness Virtual Visit.    Cardiac Risk Factors include: advanced age (>75men, >40 women)     Objective:    Today's Vitals   01/17/21 0851  Weight: 126 lb (57.2 kg)  Height: 5\' 5"  (1.651 m)   Body mass index is 20.97 kg/m.  Advanced Directives 01/17/2021  Does Patient Have a Medical Advance Directive? No  Would patient like information on creating a medical advance directive? No - Patient declined    Current Medications (verified) Outpatient Encounter Medications as of 01/17/2021  Medication Sig  . atenolol (TENORMIN) 50 MG tablet TAKE 1 TABLET BY MOUTH EVERY DAY  . Choline Fenofibrate (FENOFIBRIC ACID) 135 MG CPDR TAKE 1 CAPSULE (135 MG TOTAL) BY MOUTH DAILY.  . ergocalciferol (VITAMIN D2) 1.25 MG (50000 UT) capsule Vitamin D2 1,250 mcg (50,000 unit) capsule  . niacin 500 MG tablet Take 500 mg by mouth daily.  Dominique James Bicarbonate (ZEGERID) 20-1100 MG CAPS capsule TAKE 1 CAPSULE BY MOUTH EVERY DAY BEFORE BREAKFAST  . quinapril (ACCUPRIL) 10 MG tablet TAKE 1 TABLET BY MOUTH EVERY DAY   No facility-administered encounter medications on file as of 01/17/2021.    Allergies (verified) Alendronate sodium, Atorvastatin, Ezetimibe, Mobic [meloxicam], Penicillins, Rosuvastatin, and Simvastatin   History: Past Medical History:  Diagnosis Date  . Allergy   . Hypertriglyceridemia 11/09/2011  . Myocardial infarction [facility])    Past Surgical History:  Procedure Laterality Date  . BREAST CYST ASPIRATION     left  . CHOLECYSTECTOMY    . endocervical polyp  06/1998   removed  . GANGLION CYST EXCISION  02/2007   left knee   Family History  Problem Relation Age of Onset  . Cancer Father 75       throat  . Cancer Mother        colon  . Diabetes Mother   . Hypertension Mother   . Fibromyalgia Sister   . Heart  disease Brother   . Alcohol abuse Son   . Drug abuse Son   . Cancer Maternal Grandmother        brest  . Breast cancer Maternal Grandmother        late 37's early 30's  . Anemia Brother    Social History   Socioeconomic History  . Marital status: Married    Spouse name: Not on file  . Number of children: 2  . Years of education: Not on file  . Highest education level: Not on file  Occupational History  . Occupation: Retired from [employer]  Tobacco Use  . Smoking status: Never Smoker  . Smokeless tobacco: Never Used  Substance and Sexual Activity  . Alcohol use: Not on file  . Drug use: Not on file  . Sexual activity: Not on file  Other Topics Concern  . Not on file  Social History Narrative  . Not on file   Social Determinants of Health   Financial Resource Strain: Low Risk   . Difficulty of Paying Living Expenses: Not hard at all  Food Insecurity: No Food Insecurity  . Worried About Charity fundraiser in the Last Year: Never true  . Ran Out of Food in the Last Year: Never true  Transportation Needs: No Transportation Needs  . Lack of Transportation (Medical): No  . Lack of Transportation (Non-Medical):  No  Physical Activity: Insufficiently Active  . Days of Exercise per Week: 2 days  . Minutes of Exercise per Session: 30 min  Stress: No Stress Concern Present  . Feeling of Stress : Not at all  Social Connections: Unknown  . Frequency of Communication with Friends and Family: Not on file  . Frequency of Social Gatherings with Friends and Family: Not on file  . Attends Religious Services: Not on file  . Active Member of Clubs or Organizations: Not on file  . Attends Archivist Meetings: Not on file  . Marital Status: Married    Tobacco Counseling Counseling given: Not Answered   Clinical Intake:  Pre-visit preparation completed: Yes    Nutrition Risk Assessment: Has the patient had any N/V/D within the last 2 months?  No  Does the patient  have any non-healing wounds?  No  Has the patient had any unintentional weight loss or weight gain?  No   Diabetes: If diabetic, was a CBG obtained today?  No Did the patient bring in their glucometer from home?  No  How often do you monitor your CBG's? None.   Financial Strains and Diabetes Management: Are you having any financial strains with the device, your supplies or your medication? No .  Does the patient want to be seen by Chronic Care Management for management of their diabetes?  No  Would the patient like to be referred to a Nutritionist or for Diabetic Management?  No   Foot exam- followed by pcp. Notes no changes. Denies wounds, numbness, tingling.    Diabetes: Yes (Followed by pcp)  How often do you need to have someone help you when you read instructions, pamphlets, or other written materials from your doctor or pharmacy?: 1 - Never    Interpreter Needed?: No      Activities of Daily Living In your present state of health, do you have any difficulty performing the following activities: 01/17/2021  Hearing? N  Vision? N  Difficulty concentrating or making decisions? N  Walking or climbing stairs? N  Dressing or bathing? N  Doing errands, shopping? N  Preparing Food and eating ? N  Using the Toilet? N  In the past six months, have you accidently leaked urine? Y  Comment Managed with daily pad  Do you have problems with loss of bowel control? N  Managing your Medications? N  Managing your Finances? N  Housekeeping or managing your Housekeeping? N  Some recent data might be hidden    Patient Care Team: Leone Haven, MD as PCP - General (Family Medicine) Leone Haven, MD as Consulting Physician (Family Medicine) Christene Lye, MD (General Surgery)  Indicate any recent Medical Services you may have received from other than [facility] providers in the past year (date may be approximate).     Assessment:   This is a routine wellness  examination for Short.  I connected with [name] today by telephone and verified that I am speaking with the correct person using two identifiers. Location patient: home Location provider: work Persons participating in the virtual visit: patient, Marine scientist.    I discussed the limitations, risks, security and privacy concerns of performing an evaluation and management service by telephone and the availability of in person appointments. The patient expressed understanding and verbally consented to this telephonic visit.    Interactive audio and video telecommunications were attempted between this provider and patient, however failed, due to patient having technical difficulties OR patient did  not have access to video capability.  We continued and completed visit with audio only.  Some vital signs may be absent or patient reported.   Hearing/Vision screen  Hearing Screening   125Hz  250Hz  500Hz  1000Hz  2000Hz  3000Hz  4000Hz  6000Hz  8000Hz   Right ear:           Left ear:           Comments: Patient is able to hear conversational tones without difficulty.  No issues reported.  Vision Screening Comments: Wears corrective lenses No retinopathy reported Visual acuity not assessed, virtual visit.  They have seen their ophthalmologist in the last 12 months.    Dietary issues and exercise activities discussed: Current Exercise Habits: Home exercise routine, Type of exercise: walking, Intensity: Mild  Goals      Patient Stated   .  Increase physical activity (pt-stated)      Walk more for exercise      Depression Screen PHQ 2/9 Scores 01/17/2021 02/20/2020 11/26/2017 10/09/2016 07/12/2015  PHQ - 2 Score 0 0 0 0 0  Exception Documentation - - - - Patient refusal    Fall Risk Fall Risk  01/17/2021 02/20/2020 11/26/2017 10/09/2016 07/12/2015  Falls in the past year? 0 0 No No No  Number falls in past yr: 0 0 - - -  Injury with Fall? 0 - - - -  Follow up Falls evaluation completed Falls evaluation  completed - - -    FALL RISK PREVENTION PERTAINING TO THE HOME: Handrails in use when climbing stairs?Yes Home free of loose throw rugs in walkways, pet beds, electrical cords, etc? Yes  Adequate lighting in your home to reduce risk of falls? Yes   ASSISTIVE DEVICES UTILIZED TO PREVENT FALLS: Life alert? No  Use of a cane, walker or w/c? No . Electric scooter? Yes, in use when walking long distances.   TIMED UP AND GO: Was the test performed? No. Virtual visit.   Cognitive Function:  Patient is alert and oriented x3.  Denies difficulty focusing, making decisions, memory loss.  MMSE/6CIT deferred. Normal by direct communication/observation.      Immunizations Immunization History  Administered Date(s) Administered  . Pneumococcal Polysaccharide-23 08/16/2000  . Td 03/04/2009    Health Maintenance Health Maintenance  Topic Date Due  . FOOT EXAM  11/26/2018  . MAMMOGRAM  06/10/2020  . HEMOGLOBIN A1C  08/21/2020  . DEXA SCAN  02/19/2021 (Originally 11/13/2014)  . TETANUS/TDAP  02/19/2021 (Originally 03/05/2019)  . PNA vac Low Risk Adult (1 of 2 - PCV13) 02/19/2021 (Originally 11/13/2014)  . Fecal DNA (Cologuard)  07/16/2021  . OPHTHALMOLOGY EXAM  01/05/2022  . Hepatitis C Screening  Completed  . HPV VACCINES  Aged Out  . INFLUENZA VACCINE  Discontinued  . COVID-19 Vaccine  Discontinued   Cologuard- due 07/16/21.  Mammogram- last completed 06/11/19. Ordered per consent.   Bone density- deferred until cpe per request.   Lung Cancer Screening: (Low Dose CT Chest recommended if Age 55-80 years, 30 pack-year currently smoking OR have quit w/in 15years.) does not qualify.   Vision Screening: Recommended annual ophthalmology exams for early detection of glaucoma and other disorders of the eye. Is the patient up to date with their annual eye exam?  Yes   Dental Screening: Recommended annual dental exams for proper oral hygiene  Community Resource Referral / Chronic Care  Management: CRR required this visit?  No   CCM required this visit?  No      Plan:   Keep  all routine maintenance appointments.   Cpe 02/21/21   I have personally reviewed and noted the following in the patient's chart:   . Medical and social history . Use of alcohol, tobacco or illicit drugs  . Current medications and supplements . Functional ability and status . Nutritional status . Physical activity . Advanced directives . List of other physicians . Hospitalizations, surgeries, and ER visits in previous 12 months . Vitals . Screenings to include cognitive, depression, and falls . Referrals and appointments  In addition, I have reviewed and discussed with patient certain preventive protocols, quality metrics, and best practice recommendations. A written personalized care plan for preventive services as well as general preventive health recommendations were provided to patient via mychart.     Varney Biles, LPN   [id number]/86/3817

## 2021-01-17 NOTE — Patient Instructions (Addendum)
Dominique James , Thank you for taking time to come for your Medicare Wellness Visit. I appreciate your ongoing commitment to your health goals. Please review the following plan we discussed and let me know if I can assist you in the future.   These are the goals we discussed: Goals      Patient Stated     Increase physical activity (pt-stated)      Walk more for exercise       This is a list of the screening recommended for you and due dates:  Health Maintenance  Topic Date Due   Complete foot exam   11/26/2018   Mammogram  06/10/2020   Hemoglobin A1C  08/21/2020   DEXA scan (bone density measurement)  02/19/2021*   Tetanus Vaccine  02/19/2021*   Pneumonia vaccines (1 of 2 - PCV13) 02/19/2021*   Cologuard (Stool DNA test)  07/16/2021   Eye exam for diabetics  01/05/2022    Hepatitis C: One time screening is recommended by Center for Disease Control  (CDC) for  adults born from [birth year] through 1966.   Completed   HPV Vaccine  Aged Out   Flu Shot  Discontinued   COVID-19 Vaccine  Discontinued  *Topic was postponed. The date shown is not the original due date.    Immunizations Immunization History  Administered Date(s) Administered   Pneumococcal Polysaccharide-23 08/16/2000   Td 03/04/2009   Keep all routine maintenance appointments.   Cpe 02/21/21   Advanced directives: not yet completed  Conditions/risks identified: none new  Follow up in one year for your annual wellness visit.   Preventive Care 8 Years and Older, Female Preventive care refers to lifestyle choices and visits with your health care provider that can promote health and wellness. What does preventive care include?  A yearly physical exam. This is also called an annual well check.  Dental exams once or twice a year.  Routine eye exams. Ask your health care provider how often you should have your eyes checked.  Personal lifestyle choices, including:  Daily care of your teeth and  gums.  Regular physical activity.  Eating a healthy diet.  Avoiding tobacco and drug use.  Limiting alcohol use.  Practicing safe sex.  Taking low-dose aspirin every day.  Taking vitamin and mineral supplements as recommended by your health care provider. What happens during an annual well check? The services and screenings done by your health care provider during your annual well check will depend on your age, overall health, lifestyle risk factors, and family history of disease. Counseling  Your health care provider may ask you questions about your:  Alcohol use.  Tobacco use.  Drug use.  Emotional well-being.  Home and relationship well-being.  Sexual activity.  Eating habits.  History of falls.  Memory and ability to understand (cognition).  Work and work Statistician.  Reproductive health. Screening  You may have the following tests or measurements:  Height, weight, and BMI.  Blood pressure.  Lipid and cholesterol levels. These may be checked every 5 years, or more frequently if you are over 58 years old.  Skin check.  Lung cancer screening. You may have this screening every year starting at age 59 if you have a 30-pack-year history of smoking and currently smoke or have quit within the past 15 years.  Fecal occult blood test (FOBT) of the stool. You may have this test every year starting at age 30.  Flexible sigmoidoscopy or colonoscopy. You may have a sigmoidoscopy  every 5 years or a colonoscopy every 10 years starting at age 60.  Hepatitis C blood test.  Hepatitis B blood test.  Sexually transmitted disease (STD) testing.  Diabetes screening. This is done by checking your blood sugar (glucose) after you have not eaten for a while (fasting). You may have this done every 1-3 years.  Bone density scan. This is done to screen for osteoporosis. You may have this done starting at age 11.  Mammogram. This may be done every 1-2 years. Talk to your  health care provider about how often you should have regular mammograms. Talk with your health care provider about your test results, treatment options, and if necessary, the need for more tests. Vaccines  Your health care provider may recommend certain vaccines, such as:  Influenza vaccine. This is recommended every year.  Tetanus, diphtheria, and acellular pertussis (Tdap, Td) vaccine. You may need a Td booster every 10 years.  Zoster vaccine. You may need this after age 98.  Pneumococcal 13-valent conjugate (PCV13) vaccine. One dose is recommended after age 15.  Pneumococcal polysaccharide (PPSV23) vaccine. One dose is recommended after age 58. Talk to your health care provider about which screenings and vaccines you need and how often you need them. This information is not intended to replace advice given to you by your health care provider. Make sure you discuss any questions you have with your health care provider. Document Released: 11/12/2015 Document Revised: 07/05/2016 Document Reviewed: 08/17/2015 Elsevier Interactive Patient Education  2017 [place] Prevention in the Home Falls can cause injuries. They can happen to people of all ages. There are many things you can do to make your home safe and to help prevent falls. What can I do on the outside of my home?  Regularly fix the edges of walkways and driveways and fix any cracks.  Remove anything that might make you trip as you walk through a door, such as a raised step or threshold.  Trim any bushes or trees on the path to your home.  Use bright outdoor lighting.  Clear any walking paths of anything that might make someone trip, such as rocks or tools.  Regularly check to see if handrails are loose or broken. Make sure that both sides of any steps have handrails.  Any raised decks and porches should have guardrails on the edges.  Have any leaves, snow, or ice cleared regularly.  Use sand or salt on walking  paths during winter.  Clean up any spills in your garage right away. This includes oil or grease spills. What can I do in the bathroom?  Use night lights.  Install grab bars by the toilet and in the tub and shower. Do not use towel bars as grab bars.  Use non-skid mats or decals in the tub or shower.  If you need to sit down in the shower, use a plastic, non-slip stool.  Keep the floor dry. Clean up any water that spills on the floor as soon as it happens.  Remove soap buildup in the tub or shower regularly.  Attach bath mats securely with double-sided non-slip rug tape.  Do not have throw rugs and other things on the floor that can make you trip. What can I do in the bedroom?  Use night lights.  Make sure that you have a light by your bed that is easy to reach.  Do not use any sheets or blankets that are too big for your bed. They should  not hang down onto the floor.  Have a firm chair that has side arms. You can use this for support while you get dressed.  Do not have throw rugs and other things on the floor that can make you trip. What can I do in the kitchen?  Clean up any spills right away.  Avoid walking on wet floors.  Keep items that you use a lot in easy-to-reach places.  If you need to reach something above you, use a strong step stool that has a grab bar.  Keep electrical cords out of the way.  Do not use floor polish or wax that makes floors slippery. If you must use wax, use non-skid floor wax.  Do not have throw rugs and other things on the floor that can make you trip. What can I do with my stairs?  Do not leave any items on the stairs.  Make sure that there are handrails on both sides of the stairs and use them. Fix handrails that are broken or loose. Make sure that handrails are as long as the stairways.  Check any carpeting to make sure that it is firmly attached to the stairs. Fix any carpet that is loose or worn.  Avoid having throw rugs at  the top or bottom of the stairs. If you do have throw rugs, attach them to the floor with carpet tape.  Make sure that you have a light switch at the top of the stairs and the bottom of the stairs. If you do not have them, ask someone to add them for you. What else can I do to help prevent falls?  Wear shoes that:  Do not have high heels.  Have rubber bottoms.  Are comfortable and fit you well.  Are closed at the toe. Do not wear sandals.  If you use a stepladder:  Make sure that it is fully opened. Do not climb a closed stepladder.  Make sure that both sides of the stepladder are locked into place.  Ask someone to hold it for you, if possible.  Clearly mark and make sure that you can see:  Any grab bars or handrails.  First and last steps.  Where the edge of each step is.  Use tools that help you move around (mobility aids) if they are needed. These include:  Canes.  Walkers.  Scooters.  Crutches.  Turn on the lights when you go into a dark area. Replace any light bulbs as soon as they burn out.  Set up your furniture so you have a clear path. Avoid moving your furniture around.  If any of your floors are uneven, fix them.  If there are any pets around you, be aware of where they are.  Review your medicines with your doctor. Some medicines can make you feel dizzy. This can increase your chance of falling. Ask your doctor what other things that you can do to help prevent falls. This information is not intended to replace advice given to you by your health care provider. Make sure you discuss any questions you have with your health care provider. Document Released: 08/12/2009 Document Revised: 03/23/2016 Document Reviewed: 11/20/2014 Elsevier Interactive Patient Education  2017 [place] A mammogram is a low energy X-ray of the breasts that is done to check for abnormal changes. This procedure can screen for and detect any changes that may  indicate breast cancer. Mammograms are regularly done on women. A man may have a mammogram if he  has a lump or swelling in his breast. A mammogram can also identify other changes and variations in the breast, such as:  Inflammation of the breast tissue (mastitis).  An infected area that contains a collection of pus (abscess).  A fluid-filled sac (cyst).  Fibrocystic changes. This is when breast tissue becomes denser, which can make the tissue feel rope-like or uneven under the skin.  Tumors that are not cancerous (benign). Tell a health care provider:  About any allergies you have.  If you have breast implants.  If you have had previous breast disease, biopsy, or surgery.  If you are breastfeeding.  If you are younger than age 34.  If you have a family history of breast cancer.  Whether you are pregnant or may be pregnant. What are the risks? Generally, this is a safe procedure. However, problems may occur, including:  Exposure to radiation. Radiation levels are very low with this test.  The results being misinterpreted.  The need for further tests.  The inability of the mammogram to detect certain cancers. What happens before the procedure?  Schedule your test about 1-2 weeks after your menstrual period if you are still menstruating. This is usually when your breasts are the least tender.  If you have had a mammogram done at a different facility in the past, get the mammogram X-rays or have them sent to your current exam facility. The new and old images will be compared.  Wash your breasts and underarms on the day of the test.  Do not wear deodorants, perfumes, lotions, or powders anywhere on your body on the day of the test.  Remove any jewelry from your neck.  Wear clothes that you can change into and out of easily. What happens during the procedure?  You will undress from the waist up and put on a gown that opens in the front.  You will stand in front of the  X-ray machine.  Each breast will be placed between two plastic or glass plates. The plates will compress your breast for a few seconds. Try to stay as relaxed as possible during the procedure. This does not cause any harm to your breasts and any discomfort you feel will be very brief.  X-rays will be taken from different angles of each breast. The procedure may vary among health care providers and hospitals.   What happens after the procedure?  The mammogram will be examined by a specialist (radiologist).  You may need to repeat certain parts of the test, depending on the quality of the images. This is commonly done if the radiologist needs a better view of the breast tissue.  You may resume your normal activities.  It is up to you to get the results of your procedure. Ask your health care provider, or the department that is doing the procedure, when your results will be ready. Summary  A mammogram is a low energy X-ray of the breasts that is done to check for abnormal changes. A man may have a mammogram if he has a lump or swelling in his breast.  If you have had a mammogram done at a different facility in the past, get the mammogram X-rays or have them sent to your current exam facility in order to compare them.  Schedule your test about 1-2 weeks after your menstrual period if you are still menstruating.  For this test, each breast will be placed between two plastic or glass plates. The plates will compress your breast for  a few seconds.  Ask when your test results will be ready. Make sure you get your test results. This information is not intended to replace advice given to you by your health care provider. Make sure you discuss any questions you have with your health care provider. Document Revised: 06/06/2018 Document Reviewed: 06/06/2018 Elsevier Patient Education  [place].

## 2021-01-19 ENCOUNTER — Other Ambulatory Visit: Payer: Self-pay | Admitting: Family Medicine

## 2021-01-19 DIAGNOSIS — Z1231 Encounter for screening mammogram for malignant neoplasm of breast: Secondary | ICD-10-CM

## 2021-01-27 ENCOUNTER — Other Ambulatory Visit: Payer: Self-pay | Admitting: Internal Medicine

## 2021-02-03 ENCOUNTER — Other Ambulatory Visit: Payer: Self-pay

## 2021-02-03 ENCOUNTER — Ambulatory Visit
Admission: RE | Admit: 2021-02-03 | Discharge: 2021-02-03 | Disposition: A | Discharge status: Home / Self Care | Payer: Medicare PPO | Source: Ambulatory Visit | Attending: Family Medicine | Admitting: Family Medicine

## 2021-02-03 DIAGNOSIS — Z1231 Encounter for screening mammogram for malignant neoplasm of breast: Secondary | ICD-10-CM | POA: Diagnosis not present

## 2021-02-21 ENCOUNTER — Encounter: Payer: Self-pay | Admitting: Family Medicine

## 2021-02-21 ENCOUNTER — Ambulatory Visit (INDEPENDENT_AMBULATORY_CARE_PROVIDER_SITE_OTHER): Payer: Medicare PPO | Admitting: Family Medicine

## 2021-02-21 ENCOUNTER — Other Ambulatory Visit: Payer: Self-pay

## 2021-02-21 VITALS — BP 120/80 | HR 63 | Temp 97.0°F | Ht 65.0 in | Wt 124.2 lb

## 2021-02-21 DIAGNOSIS — E559 Vitamin D deficiency, unspecified: Secondary | ICD-10-CM | POA: Diagnosis not present

## 2021-02-21 DIAGNOSIS — Z1329 Encounter for screening for other suspected endocrine disorder: Secondary | ICD-10-CM | POA: Diagnosis not present

## 2021-02-21 DIAGNOSIS — E119 Type 2 diabetes mellitus without complications: Secondary | ICD-10-CM | POA: Diagnosis not present

## 2021-02-21 DIAGNOSIS — Z0000 Encounter for general adult medical examination without abnormal findings: Secondary | ICD-10-CM | POA: Diagnosis not present

## 2021-02-21 DIAGNOSIS — E785 Hyperlipidemia, unspecified: Secondary | ICD-10-CM

## 2021-02-21 DIAGNOSIS — Z130 Encounter for screening for diseases of the blood and blood-forming organs and certain disorders involving the immune mechanism: Secondary | ICD-10-CM

## 2021-02-21 LAB — COMPREHENSIVE METABOLIC PANEL
ALT: 15 U/L (ref 0–35)
AST: 16 U/L (ref 0–37)
Albumin: 4.2 g/dL (ref 3.5–5.2)
Alkaline Phosphatase: 63 U/L (ref 39–117)
BUN: 18 mg/dL (ref 6–23)
CO2: 25 mEq/L (ref 19–32)
Calcium: 9.8 mg/dL (ref 8.4–10.5)
Chloride: 103 mEq/L (ref 96–112)
Creatinine, Ser: 0.96 mg/dL (ref 0.40–1.20)
GFR: 59.62 mL/min — ABNORMAL LOW (ref >60.00)
Glucose, Bld: 92 mg/dL (ref 70–99)
Potassium: 4.4 mEq/L (ref 3.5–5.1)
Sodium: 138 mEq/L (ref 135–145)
Total Bilirubin: 0.5 mg/dL (ref 0.2–1.2)
Total Protein: 7.4 g/dL (ref 6.0–8.3)

## 2021-02-21 LAB — LIPID PANEL
Cholesterol: 181 mg/dL (ref 0–200)
HDL: 41.5 mg/dL (ref >39.00)
LDL Cholesterol: 112 mg/dL — ABNORMAL HIGH (ref 0–99)
NonHDL: 139.96
Total CHOL/HDL Ratio: 4
Triglycerides: 139 mg/dL (ref 0.0–149.0)
VLDL: 27.8 mg/dL (ref 0.0–40.0)

## 2021-02-21 LAB — CBC
HCT: 40.6 % (ref 36.0–46.0)
Hemoglobin: 13.5 g/dL (ref 12.0–15.0)
MCHC: 33.3 g/dL (ref 30.0–36.0)
MCV: 89.4 fl (ref 78.0–100.0)
Platelets: 441 10*3/uL — ABNORMAL HIGH (ref 150.0–400.0)
RBC: 4.54 Mil/uL (ref 3.87–5.11)
RDW: 13.8 % (ref 11.5–15.5)
WBC: 8.7 10*3/uL (ref 4.0–10.5)

## 2021-02-21 LAB — MICROALBUMIN / CREATININE URINE RATIO
Creatinine,U: 104.9 mg/dL
Microalb Creat Ratio: 1.5 mg/g (ref 0.0–30.0)
Microalb, Ur: 1.6 mg/dL (ref 0.0–1.9)

## 2021-02-21 LAB — VITAMIN D 25 HYDROXY (VIT D DEFICIENCY, FRACTURES): VITD: 19.77 ng/mL — ABNORMAL LOW (ref 30.00–100.00)

## 2021-02-21 LAB — TSH: TSH: 1.79 u[IU]/mL (ref 0.35–4.50)

## 2021-02-21 LAB — HEMOGLOBIN A1C: Hgb A1c MFr Bld: 6.5 % (ref 4.6–6.5)

## 2021-02-21 NOTE — Patient Instructions (Signed)
Nice to see you. We will contact you with your lab results. Please try to decrease your soda and sweet tea intake. Please try to increase her walking for exercise.

## 2021-02-21 NOTE — Assessment & Plan Note (Addendum)
Physical exam completed.  Encouraged healthy diet and increasing her exercise.  Discussed decreasing her soda and sweet tea intake.  Mammogram is up-to-date.  She declines a breast exam today.  Cologuard is up-to-date though will be due in about 6 months.  Plan to order at her next visit in 6 months.  She declines tetanus vaccine, shingles vaccine, pneumonia vaccine, and COVID-vaccine.  She declines DEXA scan.  Labs as outlined.  The patient was counseled to contact us with any postmenopausal bleeding.

## 2021-02-21 NOTE — Progress Notes (Signed)
Tommi Rumps, MD Phone: [phone]  Dominique James is a 70 y.o. female who presents today for CPE.  Diet: Not as much meat issues to eat, gets plenty of vegetables and salads, her volume of food intake has decreased, she does drink soda and sweet tea, not much junk food Exercise: Walking some though wants to increase this to daily Pap smear: Aged out Colonoscopy: Cologuard is up-to-date Mammogram: 02/03/2021 negative Family history-  Colon cancer: No  Breast cancer: Maternal grandmother in her 2s or 31s  Ovarian cancer: No Menses: Postmenopausal Vaccines-   Flu: Out of season  Tetanus: Declines  Shingles: Declines  COVID19: Declines  Pneumonia: Declines HIV screening: Aged out Hep C Screening: Up-to-date Tobacco use: No Alcohol use: No Illicit Drug use: No Dentist: Yes Ophthalmology: Yes   Active Ambulatory Problems    Diagnosis Date Noted  . Vitamin D deficiency 04/01/2009  . HYPERTENSION, BENIGN ESSENTIAL 03/02/2009  . MYOCARDIAL INFARCTION, HX OF 03/10/2009  . ALLERGIC RHINITIS 03/10/2009  . G E R D 07/09/2007  . Osteopenia 08/23/2000  . Diarrhea 07/09/2007  . FIBROCYSTIC BREAST DISEASE, HX OF 03/10/2009  . Routine general medical examination at a health care facility 11/09/2011  . HLD (hyperlipidemia) 11/09/2011  . Epidermal cyst 11/29/2015  . Ganglion cyst 02/28/2016  . Type 2 diabetes mellitus without complication, without long-term current use of insulin ([place]) 10/09/2016  . Hot flashes due to menopause 05/24/2017  . Breast cancer screening 11/26/2017  . Colon cancer screening 11/26/2017   Resolved Ambulatory Problems    Diagnosis Date Noted  . Pure hyperglyceridemia 03/10/2009  . LOW HDL 04/01/2009  . Ganglion of joint 06/23/2009  . EMESIS 07/09/2007   Past Medical History:  Diagnosis Date  . Allergy   . Hypertriglyceridemia 11/09/2011  . Myocardial infarction [facility])     Family History  Problem Relation Age of Onset  . Cancer Father 75        throat  . Cancer Mother        colon  . Diabetes Mother   . Hypertension Mother   . Fibromyalgia Sister   . Heart disease Brother   . Alcohol abuse Son   . Drug abuse Son   . Cancer Maternal Grandmother        brest  . Breast cancer Maternal Grandmother        late 71's early 98's  . Anemia Brother     Social History   Socioeconomic History  . Marital status: Married    Spouse name: Not on file  . Number of children: 2  . Years of education: Not on file  . Highest education level: Not on file  Occupational History  . Occupation: Retired from [employer]  Tobacco Use  . Smoking status: Never Smoker  . Smokeless tobacco: Never Used  Substance and Sexual Activity  . Alcohol use: Not on file  . Drug use: Not on file  . Sexual activity: Not on file  Other Topics Concern  . Not on file  Social History Narrative  . Not on file   Social Determinants of Health   Financial Resource Strain: Low Risk   . Difficulty of Paying Living Expenses: Not hard at all  Food Insecurity: No Food Insecurity  . Worried About Charity fundraiser in the Last Year: Never true  . Ran Out of Food in the Last Year: Never true  Transportation Needs: No Transportation Needs  . Lack of Transportation (Medical): No  . Lack of  Transportation (Non-Medical): No  Physical Activity: Insufficiently Active  . Days of Exercise per Week: 2 days  . Minutes of Exercise per Session: 30 min  Stress: No Stress Concern Present  . Feeling of Stress : Not at all  Social Connections: Unknown  . Frequency of Communication with Friends and Family: Not on file  . Frequency of Social Gatherings with Friends and Family: Not on file  . Attends Religious Services: Not on file  . Active Member of Clubs or Organizations: Not on file  . Attends Archivist Meetings: Not on file  . Marital Status: Married  Human resources officer Violence: Not At Risk  . Fear of Current or Ex-Partner: No  . Emotionally Abused: No  .  Physically Abused: No  . Sexually Abused: No    ROS  General:  Negative for nexplained weight loss, fever Skin: Negative for new or changing mole, sore that won't heal HEENT: Negative for trouble hearing, trouble seeing, ringing in ears, mouth sores, hoarseness, change in voice, dysphagia. CV:  Negative for chest pain, dyspnea, edema, palpitations Resp: Negative for cough, dyspnea, hemoptysis GI: Negative for nausea, vomiting, diarrhea, constipation, abdominal pain, melena, hematochezia. GU: Negative for dysuria, incontinence, urinary hesitance, hematuria, vaginal or penile discharge, polyuria, sexual difficulty, lumps in testicle or breasts MSK: Negative for muscle cramps or aches, joint pain or swelling Neuro: Negative for headaches, weakness, numbness, dizziness, passing out/fainting Psych: Negative for depression, anxiety, memory problems  Objective  Physical Exam Vitals:   02/21/21 0928  BP: 120/80  Pulse: 63  Temp: (!) 97 F (36.1 C)  SpO2: 98%    BP Readings from Last 3 Encounters:  02/21/21 120/80  02/20/20 120/70  05/28/18 120/82   Wt Readings from Last 3 Encounters:  02/21/21 124 lb 3.2 oz (56.3 kg)  01/17/21 126 lb (57.2 kg)  02/20/20 126 lb 12.8 oz (57.5 kg)    Physical Exam Constitutional:      General: She is not in acute distress.    Appearance: She is not diaphoretic.  HENT:     Head: Normocephalic and atraumatic.  Eyes:     Conjunctiva/sclera: Conjunctivae normal.     Pupils: Pupils are equal, round, and reactive to light.  Cardiovascular:     Rate and Rhythm: Normal rate and regular rhythm.     Heart sounds: Normal heart sounds.  Pulmonary:     Effort: Pulmonary effort is normal.     Breath sounds: Normal breath sounds.  Abdominal:     General: Bowel sounds are normal. There is no distension.     Palpations: Abdomen is soft.     Tenderness: There is no abdominal tenderness. There is no guarding or rebound.  Musculoskeletal:     Right lower  leg: No edema.     Left lower leg: No edema.  Lymphadenopathy:     Cervical: No cervical adenopathy.  Skin:    General: Skin is warm and dry.  Neurological:     Mental Status: She is alert.  Psychiatric:        Mood and Affect: Mood normal.      Assessment/Plan:   Problem List Items Addressed This Visit    Vitamin D deficiency   Relevant Orders   Vitamin D (25 hydroxy)   Routine general medical examination at a health care facility - Primary    Physical exam completed.  Encouraged healthy diet and increasing her exercise.  Discussed decreasing her soda and sweet tea intake.  Mammogram is up-to-date.  She declines a breast exam today.  Cologuard is up-to-date though will be due in about 6 months.  Plan to order at her next visit in 6 months.  She declines tetanus vaccine, shingles vaccine, pneumonia vaccine, and COVID-vaccine.  She declines DEXA scan.  Labs as outlined.  The patient was counseled to contact us with any postmenopausal bleeding.      HLD (hyperlipidemia)   Relevant Medications   cholestyramine (QUESTRAN) 4 g packet   Other Relevant Orders   Comp Met (CMET)   Lipid panel   Type 2 diabetes mellitus without complication, without long-term current use of insulin (HCC)   Relevant Orders   HgB A1c   Urine Microalbumin w/creat. ratio    Other Visit Diagnoses    Screening for deficiency anemia       Relevant Orders   CBC   Thyroid disorder screen       Relevant Orders   TSH      This visit occurred during the SARS-CoV-2 public health emergency.  Safety protocols were in place, including screening questions prior to the visit, additional usage of staff PPE, and extensive cleaning of exam room while observing appropriate contact time as indicated for disinfecting solutions.    Tommi Rumps, MD [place]

## 2021-02-25 ENCOUNTER — Telehealth: Payer: Self-pay | Admitting: Family Medicine

## 2021-02-25 NOTE — Telephone Encounter (Signed)
Patient was returning call for lab results 

## 2021-02-28 ENCOUNTER — Other Ambulatory Visit: Payer: Self-pay | Admitting: Family Medicine

## 2021-02-28 DIAGNOSIS — D75839 Thrombocytosis, unspecified: Secondary | ICD-10-CM

## 2021-02-28 NOTE — Telephone Encounter (Signed)
Lab results were given to patient after a call back.  Daysie Helf,cma

## 2021-03-14 ENCOUNTER — Other Ambulatory Visit (INDEPENDENT_AMBULATORY_CARE_PROVIDER_SITE_OTHER): Payer: Medicare PPO

## 2021-03-14 ENCOUNTER — Other Ambulatory Visit: Payer: Self-pay

## 2021-03-14 DIAGNOSIS — D75839 Thrombocytosis, unspecified: Secondary | ICD-10-CM

## 2021-03-14 LAB — CBC
HCT: 35.8 % — ABNORMAL LOW (ref 36.0–46.0)
Hemoglobin: 12.5 g/dL (ref 12.0–15.0)
MCHC: 34.9 g/dL (ref 30.0–36.0)
MCV: 88.8 fl (ref 78.0–100.0)
Platelets: 398 10*3/uL (ref 150.0–400.0)
RBC: 4.03 Mil/uL (ref 3.87–5.11)
RDW: 13.8 % (ref 11.5–15.5)
WBC: 7.8 10*3/uL (ref 4.0–10.5)

## 2021-03-22 ENCOUNTER — Other Ambulatory Visit: Payer: Self-pay | Admitting: Family Medicine

## 2021-03-22 DIAGNOSIS — D649 Anemia, unspecified: Secondary | ICD-10-CM

## 2021-03-23 ENCOUNTER — Telehealth: Payer: Self-pay

## 2021-03-23 NOTE — Telephone Encounter (Signed)
-----   Message from Leone Haven, MD sent at 03/22/2021  4:24 PM EDT ----- Please let the patient know that her platelets are now in the normal range.  Her hematocrit is very minimally low.  I would advise rechecking this in a couple of weeks.  Order placed.

## 2021-03-23 NOTE — Telephone Encounter (Signed)
Patent was informed of her lab results and understood.  The provider wanted her to have a recheck of her hematocrit in 2 weeks and patient stated she would call back to schedule.  Vearl Allbaugh,cma

## 2021-05-20 ENCOUNTER — Other Ambulatory Visit (INDEPENDENT_AMBULATORY_CARE_PROVIDER_SITE_OTHER): Payer: Medicare PPO

## 2021-05-20 ENCOUNTER — Other Ambulatory Visit: Payer: Self-pay

## 2021-05-20 DIAGNOSIS — D649 Anemia, unspecified: Secondary | ICD-10-CM | POA: Diagnosis not present

## 2021-05-20 LAB — CBC
HCT: 36.7 % (ref 36.0–46.0)
Hemoglobin: 12.4 g/dL (ref 12.0–15.0)
MCHC: 33.8 g/dL (ref 30.0–36.0)
MCV: 90.1 fl (ref 78.0–100.0)
Platelets: 397 10*3/uL (ref 150.0–400.0)
RBC: 4.08 Mil/uL (ref 3.87–5.11)
RDW: 13.2 % (ref 11.5–15.5)
WBC: 6.7 10*3/uL (ref 4.0–10.5)

## 2021-06-10 ENCOUNTER — Other Ambulatory Visit: Payer: Self-pay | Admitting: Family Medicine

## 2021-07-09 ENCOUNTER — Other Ambulatory Visit: Payer: Self-pay | Admitting: Family Medicine

## 2021-07-26 ENCOUNTER — Telehealth: Payer: Medicare PPO | Admitting: Physician Assistant

## 2021-07-26 ENCOUNTER — Encounter: Payer: Self-pay | Admitting: Family Medicine

## 2021-07-26 DIAGNOSIS — B9689 Other specified bacterial agents as the cause of diseases classified elsewhere: Secondary | ICD-10-CM

## 2021-07-26 DIAGNOSIS — J208 Acute bronchitis due to other specified organisms: Secondary | ICD-10-CM | POA: Diagnosis not present

## 2021-07-26 MED ORDER — BENZONATATE 100 MG PO CAPS: 100.0000 mg | ORAL_CAPSULE | Freq: Three times a day (TID) | ORAL | 0 refills | Status: DC | PRN

## 2021-07-26 MED ORDER — AZITHROMYCIN 250 MG PO TABS: ORAL_TABLET | ORAL | 0 refills | Status: DC

## 2021-07-26 NOTE — Patient Instructions (Signed)
Dominique James, thank you for joining Mar Daring, PA-C for today's virtual visit.  While this provider is not your primary care provider (PCP), if your PCP is located in our provider database this encounter information will be shared with them immediately following your visit.  Consent: (Patient) Dominique James provided verbal consent for this virtual visit at the beginning of the encounter.  Current Medications:  Current Outpatient Medications:    azithromycin (ZITHROMAX) 250 MG tablet, Take 2 tablets PO on day one, and one tablet PO daily thereafter until completed., Disp: 6 tablet, Rfl: 0   benzonatate (TESSALON) 100 MG capsule, Take 1 capsule (100 mg total) by mouth 3 (three) times daily as needed., Disp: 30 capsule, Rfl: 0   atenolol (TENORMIN) 50 MG tablet, TAKE 1 TABLET BY MOUTH EVERY DAY, Disp: 90 tablet, Rfl: 1   cholestyramine (QUESTRAN) 4 g packet, cholestyramine (with sugar) 4 gram powder for susp in a packet, Disp: , Rfl:    Choline Fenofibrate (FENOFIBRIC ACID) 135 MG CPDR, TAKE 1 CAPSULE (135 MG TOTAL) BY MOUTH DAILY., Disp: 90 capsule, Rfl: 3   ergocalciferol (VITAMIN D2) 1.25 MG (50000 UT) capsule, Vitamin D2 1,250 mcg (50,000 unit) capsule (Patient not taking: Reported on 02/21/2021), Disp: , Rfl:    niacin 500 MG tablet, Take 500 mg by mouth daily., Disp: , Rfl:    Omeprazole-Sodium Bicarbonate (ZEGERID) 20-1100 MG CAPS capsule, TAKE 1 CAPSULE BY MOUTH EVERY DAY BEFORE BREAKFAST (Patient not taking: Reported on 02/21/2021), Disp: 30 capsule, Rfl: 3   quinapril (ACCUPRIL) 10 MG tablet, TAKE 1 TABLET BY MOUTH EVERY DAY, Disp: 90 tablet, Rfl: 3   Medications ordered in this encounter:  Meds ordered this encounter  Medications   azithromycin (ZITHROMAX) 250 MG tablet    Sig: Take 2 tablets PO on day one, and one tablet PO daily thereafter until completed.    Dispense:  6 tablet    Refill:  0    Order Specific Question:   Supervising Provider    Answer:   MILLER,  BRIAN [[id number]]   benzonatate (TESSALON) 100 MG capsule    Sig: Take 1 capsule (100 mg total) by mouth 3 (three) times daily as needed.    Dispense:  30 capsule    Refill:  0    Order Specific Question:   Supervising Provider    Answer:   Sabra Heck, [place]     *If you need refills on other medications prior to your next appointment, please contact your pharmacy*  Follow-Up: Call back or seek an in-person evaluation if the symptoms worsen or if the condition fails to improve as anticipated.  Other Instructions Acute Bronchitis, Adult Acute bronchitis is when air tubes in the lungs (bronchi) suddenly get swollen. The condition can make it hard for you to breathe. In adults, acute bronchitis usually goes away within 2 weeks. A cough caused by bronchitis may last up to 3 weeks. Smoking, allergies, and asthma can make the condition worse. What are the causes? This condition is caused by: Cold and flu viruses. The most common cause of this condition is the virus that causes the common cold. Bacteria. Substances that irritate the lungs, including: Smoke from cigarettes and other types of tobacco. Dust and pollen. Fumes from chemicals, gases, or burned fuel. Other materials that pollute indoor or outdoor air. Close contact with someone who has acute bronchitis. What increases the risk? The following factors may make you more likely to develop this condition: A weak  body's defense system. This is also called the immune system. Any condition that affects your lungs and breathing, such as asthma. What are the signs or symptoms? Symptoms of this condition include: A cough. Coughing up clear, yellow, or green mucus. Wheezing. Having too much mucus in your lungs (chest congestion). Shortness of breath. A fever. Chills. Body aches. A sore throat. How is this treated? Acute bronchitis may go away over time without treatment. Your doctor may recommend: Drinking more fluids. Using a device  that gets medicine into your lungs (inhaler). Using a vaporizer or a humidifier. These are machines that add water or moisture to the air. This helps with coughing and poor breathing. Taking a medicine for fever. Taking a medicine that thins mucus and clears congestion. Taking a medicine that prevents or stops coughing. Follow these instructions at home: Activity Get a lot of rest. Return to your normal activities as told by your doctor. Ask your doctor what activities are safe for you. Lifestyle  Drink enough fluid to keep your pee (urine) pale yellow. Do not drink alcohol. Do not use any products that contain nicotine or tobacco, such as cigarettes, e-cigarettes, and chewing tobacco. If you need help quitting, ask your doctor. Be aware that: Your bronchitis will get worse if you smoke or breathe in other people's smoke (secondhand smoke). Your lungs will heal faster if you quit smoking. General instructions Take over-the-counter and prescription medicines only as told by your doctor. Use an inhaler, cool mist vaporizer, or humidifier as told by your doctor. Rinse your mouth often with salt water. To make salt water, dissolve -1 tsp (3-6 g) of salt in 1 cup (237 mL) of warm water. Take two teaspoons of honey at bedtime. This helps lessen your coughing at night. Keep all follow-up visits as told by your doctor. This is important. How is this prevented? To lower your risk of getting this condition again: Wash your hands often with soap and water. If you cannot use soap and water, use hand sanitizer. Avoid contact with people who have cold symptoms. Try not to touch your mouth, nose, or eyes with your hands. Make sure to get the flu shot every year. Contact a doctor if: Your symptoms do not get better in 2 weeks. You vomit more than once or twice. You have symptoms of loss of fluid from your body (dehydration). These include: Dark pee. Dry skin or eyes. Increased  thirst. Headaches. Confusion. Muscle cramps. Get help right away if: You cough up blood. You have chest pain. You have very bad shortness of breath. You become dehydrated. You faint or keep feeling like you are going to faint. You have a very bad headache. Your fever or chills get worse. These symptoms may be an emergency. Get help right away. Call your local emergency services (911 in the [place] Do not wait to see if the symptoms will go away. Do not drive yourself to the hospital. Summary Acute bronchitis is when air tubes in the lungs (bronchi) suddenly get swollen. In adults, acute bronchitis usually goes away within 2 weeks. Take over-the-counter and prescription medicines only as told by your doctor. Drink enough fluid to keep your pee (urine) pale yellow. Contact a doctor if your symptoms do not improve after 2 weeks of treatment. Get help right away if you cough up blood, faint, or have chest pain or shortness of breath. This information is not intended to replace advice given to you by your health care provider. Make sure  you discuss any questions you have with your health care provider. Document Revised: 09/15/2020 Document Reviewed: 05/09/2019 Elsevier Patient Education  2022 [employer].    If you have been instructed to have an in-person evaluation today at a local Urgent Care facility, please use the link below. It will take you to a list of all of our available [facility], including address, phone number and hours of operation. Please do not delay care.  [facility]  If you or a family member do not have a primary care provider, use the link below to schedule a visit and establish care. When you choose a [facility] primary care physician or advanced practice provider, you gain a long-term partner in health. Find a Primary Care Provider  Learn more about [facility]'s in-office and virtual care options: [facility]

## 2021-07-26 NOTE — Telephone Encounter (Signed)
She needs to at least complete a virtual visit to evaluate what is going on with her and determine appropriate treatment. She could be scheduled with anyone that has an opening or she could complete a mychart virtual visit.

## 2021-07-26 NOTE — Progress Notes (Signed)
Virtual Visit Consent   JERICHA BRYDEN, you are scheduled for a virtual visit with a [facility] provider today.     Just as with appointments in the office, your consent must be obtained to participate.  Your consent will be active for this visit and any virtual visit you may have with one of our providers in the next 365 days.     If you have a MyChart account, a copy of this consent can be sent to you electronically.  All virtual visits are billed to your insurance company just like a traditional visit in the office.    As this is a virtual visit, video technology does not allow for your provider to perform a traditional examination.  This may limit your provider's ability to fully assess your condition.  If your provider identifies any concerns that need to be evaluated in person or the need to arrange testing (such as labs, EKG, etc.), we will make arrangements to do so.     Although advances in technology are sophisticated, we cannot ensure that it will always work on either your end or our end.  If the connection with a video visit is poor, the visit may have to be switched to a telephone visit.  With either a video or telephone visit, we are not always able to ensure that we have a secure connection.     I need to obtain your verbal consent now.   Are you willing to proceed with your visit today?    MEDRITH VEILLON has provided verbal consent on 07/26/2021 for a virtual visit (video or telephone).   Mar Daring, PA-C   Date: 07/26/2021 8:49 AM   Virtual Visit via Video Note   I, Mar Daring, connected with  KADI HESSION  ([id number], 1951/06/07) on 07/26/21 at  8:45 AM EDT by a video-enabled telemedicine application and verified that I am speaking with the correct person using two identifiers.  Location: Patient: Virtual Visit Location Patient: Home Provider: Virtual Visit Location Provider: Home Office   I discussed the limitations of evaluation and  management by telemedicine and the availability of in person appointments. The patient expressed understanding and agreed to proceed.    History of Present Illness: LARENA OHNEMUS is a 70 y.o. who identifies as a female who was assigned female at birth, and is being seen today for URI symptoms.  HPI: URI  This is a new problem. The current episode started in the past 7 days (Friday). The problem has been gradually worsening. There has been no fever. Associated symptoms include congestion, coughing and a sore throat (raspy voice). Pertinent negatives include no diarrhea, ear pain, headaches, nausea, plugged ear sensation, rhinorrhea, sinus pain or vomiting. She has tried nothing for the symptoms.     Problems:  Patient Active Problem List   Diagnosis Date Noted   Breast cancer screening 11/26/2017   Colon cancer screening 11/26/2017   Hot flashes due to menopause 05/24/2017   Type 2 diabetes mellitus without complication, without long-term current use of insulin ([place]) 10/09/2016   Ganglion cyst 02/28/2016   Epidermal cyst 11/29/2015   Routine general medical examination at a health care facility 11/09/2011   HLD (hyperlipidemia) 11/09/2011   Vitamin D deficiency 04/01/2009   MYOCARDIAL INFARCTION, HX OF 03/10/2009   ALLERGIC RHINITIS 03/10/2009   FIBROCYSTIC BREAST DISEASE, HX OF 03/10/2009   HYPERTENSION, BENIGN ESSENTIAL 03/02/2009   G E R D 07/09/2007   Diarrhea 07/09/2007  Osteopenia 08/23/2000    Allergies:  Allergies  Allergen Reactions   Alendronate Sodium     REACTION: hip pain   Atorvastatin     REACTION: myalgias   Ezetimibe     REACTION: side effects   Mobic [Meloxicam] Hives   Penicillins Itching   Rosuvastatin     REACTION: myalgias, GI upset   Simvastatin     REACTION: myalgias   Medications:  Current Outpatient Medications:    azithromycin (ZITHROMAX) 250 MG tablet, Take 2 tablets PO on day one, and one tablet PO daily thereafter until completed., Disp:  6 tablet, Rfl: 0   benzonatate (TESSALON) 100 MG capsule, Take 1 capsule (100 mg total) by mouth 3 (three) times daily as needed., Disp: 30 capsule, Rfl: 0   atenolol (TENORMIN) 50 MG tablet, TAKE 1 TABLET BY MOUTH EVERY DAY, Disp: 90 tablet, Rfl: 1   cholestyramine (QUESTRAN) 4 g packet, cholestyramine (with sugar) 4 gram powder for susp in a packet, Disp: , Rfl:    Choline Fenofibrate (FENOFIBRIC ACID) 135 MG CPDR, TAKE 1 CAPSULE (135 MG TOTAL) BY MOUTH DAILY., Disp: 90 capsule, Rfl: 3   ergocalciferol (VITAMIN D2) 1.25 MG (50000 UT) capsule, Vitamin D2 1,250 mcg (50,000 unit) capsule (Patient not taking: Reported on 02/21/2021), Disp: , Rfl:    niacin 500 MG tablet, Take 500 mg by mouth daily., Disp: , Rfl:    Omeprazole-Sodium Bicarbonate (ZEGERID) 20-1100 MG CAPS capsule, TAKE 1 CAPSULE BY MOUTH EVERY DAY BEFORE BREAKFAST (Patient not taking: Reported on 02/21/2021), Disp: 30 capsule, Rfl: 3   quinapril (ACCUPRIL) 10 MG tablet, TAKE 1 TABLET BY MOUTH EVERY DAY, Disp: 90 tablet, Rfl: 3  Observations/Objective: Patient is well-developed, well-nourished in no acute distress.  Resting comfortably at home.  Head is normocephalic, atraumatic.  No labored breathing.  Speech is clear and coherent with logical content.  Patient is alert and oriented at baseline.    Assessment and Plan: 1. Acute bacterial bronchitis - azithromycin (ZITHROMAX) 250 MG tablet; Take 2 tablets PO on day one, and one tablet PO daily thereafter until completed.  Dispense: 6 tablet; Refill: 0 - benzonatate (TESSALON) 100 MG capsule; Take 1 capsule (100 mg total) by mouth 3 (three) times daily as needed.  Dispense: 30 capsule; Refill: 0  - Worsening and patient higher risk for developing pneumonia.  - Will treat with zpak and tessalon perles.  - Push fluids.  - Rest.  - Call or seek in person evaluation if worsening or fails to improve.   Follow Up Instructions: I discussed the assessment and treatment plan with the  patient. The patient was provided an opportunity to ask questions and all were answered. The patient agreed with the plan and demonstrated an understanding of the instructions.  A copy of instructions were sent to the patient via MyChart unless otherwise noted below.   The patient was advised to call back or seek an in-person evaluation if the symptoms worsen or if the condition fails to improve as anticipated.  Time:  I spent 10 minutes with the patient via telehealth technology discussing the above problems/concerns.    Mar Daring, PA-C

## 2021-07-27 ENCOUNTER — Other Ambulatory Visit: Payer: Self-pay

## 2021-07-27 NOTE — Telephone Encounter (Signed)
LVM for patient to call and schedule a appointment to be seen.  Fatina Sprankle,cma

## 2021-12-30 ENCOUNTER — Encounter: Payer: Self-pay | Admitting: Family Medicine

## 2022-01-07 ENCOUNTER — Other Ambulatory Visit: Payer: Self-pay | Admitting: Family Medicine

## 2022-01-09 DIAGNOSIS — Z1211 Encounter for screening for malignant neoplasm of colon: Secondary | ICD-10-CM | POA: Diagnosis not present

## 2022-01-16 LAB — COLOGUARD: COLOGUARD: NEGATIVE

## 2022-01-18 ENCOUNTER — Ambulatory Visit: Payer: Medicare PPO

## 2022-01-23 DIAGNOSIS — H52223 Regular astigmatism, bilateral: Secondary | ICD-10-CM | POA: Diagnosis not present

## 2022-01-23 DIAGNOSIS — H5203 Hypermetropia, bilateral: Secondary | ICD-10-CM | POA: Diagnosis not present

## 2022-01-23 DIAGNOSIS — H524 Presbyopia: Secondary | ICD-10-CM | POA: Diagnosis not present

## 2022-01-23 DIAGNOSIS — H2513 Age-related nuclear cataract, bilateral: Secondary | ICD-10-CM | POA: Diagnosis not present

## 2022-01-23 DIAGNOSIS — Z135 Encounter for screening for eye and ear disorders: Secondary | ICD-10-CM | POA: Diagnosis not present

## 2022-01-25 ENCOUNTER — Ambulatory Visit: Payer: Medicare PPO

## 2022-01-28 ENCOUNTER — Other Ambulatory Visit: Payer: Self-pay | Admitting: Internal Medicine

## 2022-01-30 ENCOUNTER — Other Ambulatory Visit: Payer: Self-pay | Admitting: Internal Medicine

## 2022-01-30 DIAGNOSIS — I10 Essential (primary) hypertension: Secondary | ICD-10-CM

## 2022-01-31 NOTE — Telephone Encounter (Signed)
When I spoke with patient she was in the grocery store. She stated that she would call back when she was home. She also said that she has been on quinapril for years & she did not recall what or if anything taken prior.  ?

## 2022-01-31 NOTE — Telephone Encounter (Signed)
This patient is due for follow-up.  Please contact her and get her scheduled.  Please see if she has been on any medications other than the quinapril in the past for her blood pressure.  Thanks. ?

## 2022-02-01 NOTE — Telephone Encounter (Signed)
Please reach back out to the patient.  I sent telmisartan in for her.  She will stop the quinapril and switch to the telmisartan the next day.  She will need to have a follow-up with me with labs in about a week to see what her blood pressure is on the new medication.  If there are no openings with me she can have a nurse visit for a blood pressure check and lab work. ?

## 2022-02-06 ENCOUNTER — Telehealth: Payer: Self-pay | Admitting: Family Medicine

## 2022-02-06 NOTE — Telephone Encounter (Signed)
Patient will call back to schedule a BP check /labs with Dr Caryl Bis this week, IF  not appointments with him Ok to schedule nurse visit for BP/labs, make sure Dr Caryl Bis is here that day if nurse visit.  ?

## 2022-02-06 NOTE — Telephone Encounter (Signed)
LMTCB

## 2022-02-08 ENCOUNTER — Ambulatory Visit: Payer: Medicare PPO | Admitting: Family Medicine

## 2022-04-20 ENCOUNTER — Other Ambulatory Visit: Payer: Self-pay | Admitting: Family Medicine

## 2022-05-22 ENCOUNTER — Encounter: Payer: Self-pay | Admitting: Family Medicine

## 2022-05-22 ENCOUNTER — Ambulatory Visit (INDEPENDENT_AMBULATORY_CARE_PROVIDER_SITE_OTHER): Payer: Medicare PPO | Admitting: Family Medicine

## 2022-05-22 ENCOUNTER — Telehealth: Payer: Self-pay | Admitting: Family Medicine

## 2022-05-22 VITALS — BP 118/76 | HR 75 | Temp 97.8°F | Ht 65.0 in | Wt 122.6 lb

## 2022-05-22 DIAGNOSIS — Z0001 Encounter for general adult medical examination with abnormal findings: Secondary | ICD-10-CM

## 2022-05-22 DIAGNOSIS — E785 Hyperlipidemia, unspecified: Secondary | ICD-10-CM

## 2022-05-22 DIAGNOSIS — Z1231 Encounter for screening mammogram for malignant neoplasm of breast: Secondary | ICD-10-CM | POA: Diagnosis not present

## 2022-05-22 DIAGNOSIS — E559 Vitamin D deficiency, unspecified: Secondary | ICD-10-CM | POA: Diagnosis not present

## 2022-05-22 DIAGNOSIS — E119 Type 2 diabetes mellitus without complications: Secondary | ICD-10-CM

## 2022-05-22 DIAGNOSIS — L819 Disorder of pigmentation, unspecified: Secondary | ICD-10-CM

## 2022-05-22 LAB — LIPID PANEL
Cholesterol: 185 mg/dL (ref 0–200)
HDL: 42.7 mg/dL (ref >39.00)
LDL Cholesterol: 118 mg/dL — ABNORMAL HIGH (ref 0–99)
NonHDL: 141.89
Total CHOL/HDL Ratio: 4
Triglycerides: 121 mg/dL (ref 0.0–149.0)
VLDL: 24.2 mg/dL (ref 0.0–40.0)

## 2022-05-22 LAB — COMPREHENSIVE METABOLIC PANEL
ALT: 11 U/L (ref 0–35)
AST: 14 U/L (ref 0–37)
Albumin: 4.5 g/dL (ref 3.5–5.2)
Alkaline Phosphatase: 52 U/L (ref 39–117)
BUN: 23 mg/dL (ref 6–23)
CO2: 26 mEq/L (ref 19–32)
Calcium: 9.6 mg/dL (ref 8.4–10.5)
Chloride: 105 mEq/L (ref 96–112)
Creatinine, Ser: 1.04 mg/dL (ref 0.40–1.20)
GFR: 53.69 mL/min — ABNORMAL LOW (ref >60.00)
Glucose, Bld: 92 mg/dL (ref 70–99)
Potassium: 4.1 mEq/L (ref 3.5–5.1)
Sodium: 140 mEq/L (ref 135–145)
Total Bilirubin: 0.5 mg/dL (ref 0.2–1.2)
Total Protein: 7.1 g/dL (ref 6.0–8.3)

## 2022-05-22 LAB — VITAMIN D 25 HYDROXY (VIT D DEFICIENCY, FRACTURES): VITD: 29.38 ng/mL — ABNORMAL LOW (ref 30.00–100.00)

## 2022-05-22 LAB — HEMOGLOBIN A1C: Hgb A1c MFr Bld: 6.3 % (ref 4.6–6.5)

## 2022-05-22 NOTE — Progress Notes (Signed)
Dominique Rumps, MD Phone: [phone]  Dominique James is a 71 y.o. female who presents today for CPE.  Diet: difficult to manage as her husband has been sick, does eat some sweets, has cut out sodas, not much fried foods Exercise: none Pap smear: aged out Cologuard: 01/09/22 negative Mammogram: due Family history-  Colon cancer: no  Breast cancer: maternal grandmother  Ovarian cancer: no  Melanoma: mother Menses: postmenopausal Vaccines-   Flu: out of season  Tetanus: due  Shingles: due  COVID19: declined   Pneumonia: due Hep C Screening: UTD Tobacco use: no Alcohol use: no Illicit Drug use: no Dentist: yes Ophthalmology: yes  Skin lesion: Patient notes a skin lesion on her left cheek has been present for 2 months.  It has changed shape.  It has not gotten much larger.  No irritation or bleeding.  Her mother did have melanoma.   Active Ambulatory Problems    Diagnosis Date Noted   Vitamin D deficiency 04/01/2009   HYPERTENSION, BENIGN ESSENTIAL 03/02/2009   MYOCARDIAL INFARCTION, HX OF 03/10/2009   ALLERGIC RHINITIS 03/10/2009   G E R D 07/09/2007   Osteopenia 08/23/2000   Diarrhea 07/09/2007   FIBROCYSTIC BREAST DISEASE, HX OF 03/10/2009   Encounter for general adult medical examination with abnormal findings 11/09/2011   HLD (hyperlipidemia) 11/09/2011   Epidermal cyst 11/29/2015   Ganglion cyst 02/28/2016   Type 2 diabetes mellitus without complication, without long-term current use of insulin ([place]) 10/09/2016   Hot flashes due to menopause 05/24/2017   Breast cancer screening 11/26/2017   Colon cancer screening 11/26/2017   Hyperpigmented skin lesion 05/22/2022   Resolved Ambulatory Problems    Diagnosis Date Noted   Pure hyperglyceridemia 03/10/2009   LOW HDL 04/01/2009   Ganglion of joint 06/23/2009   EMESIS 07/09/2007   Past Medical History:  Diagnosis Date   Allergy    Hypertriglyceridemia 11/09/2011   Myocardial infarction [facility])      Family History  Problem Relation Age of Onset   Cancer Father 17       throat   Cancer Mother        colon   Diabetes Mother    Hypertension Mother    Fibromyalgia Sister    Heart disease Brother    Alcohol abuse Son    Drug abuse Son    Cancer Maternal Grandmother        brest   Breast cancer Maternal Grandmother        late 36's early 74's   Anemia Brother     Social History   Socioeconomic History   Marital status: Married    Spouse name: Not on file   Number of children: 2   Years of education: Not on file   Highest education level: Not on file  Occupational History   Occupation: Retired from [employer]  Tobacco Use   Smoking status: Never   Smokeless tobacco: Never  Substance and Sexual Activity   Alcohol use: Not on file   Drug use: Not on file   Sexual activity: Not on file  Other Topics Concern   Not on file  Social History Narrative   Not on file   Social Determinants of Health   Financial Resource Strain: Low Risk  (01/17/2021)   Overall Financial Resource Strain (CARDIA)    Difficulty of Paying Living Expenses: Not hard at all  Food Insecurity: No Food Insecurity (01/17/2021)   Hunger Vital Sign    Worried About Running Out of  Food in the Last Year: Never true    [place] in the Last Year: Never true  Transportation Needs: No Transportation Needs (01/17/2021)   PRAPARE - Hydrologist (Medical): No    Lack of Transportation (Non-Medical): No  Physical Activity: Insufficiently Active (01/17/2021)   Exercise Vital Sign    Days of Exercise per Week: 2 days    Minutes of Exercise per Session: 30 min  Stress: No Stress Concern Present (01/17/2021)   [place]    Feeling of Stress : Not at all  Social Connections: Unknown (01/17/2021)   Social Connection and Isolation Panel [NHANES]    Frequency of Communication with Friends and Family: Not on file     Frequency of Social Gatherings with Friends and Family: Not on file    Attends Religious Services: Not on file    Active Member of Clubs or Organizations: Not on file    Attends Archivist Meetings: Not on file    Marital Status: Married  Intimate Partner Violence: Not At Risk (01/17/2021)   Humiliation, Afraid, Rape, and Kick questionnaire    Fear of Current or Ex-Partner: No    Emotionally Abused: No    Physically Abused: No    Sexually Abused: No    ROS  General:  Negative for nexplained weight loss, fever Skin: Positive for new or changing mole, negative for sore that won't heal HEENT: Negative for trouble hearing, trouble seeing, ringing in ears, mouth sores, hoarseness, change in voice, dysphagia. CV:  Negative for chest pain, dyspnea, edema, palpitations Resp: Negative for cough, dyspnea, hemoptysis GI: Negative for nausea, vomiting, diarrhea, constipation, abdominal pain, melena, hematochezia. GU: Negative for dysuria, incontinence, urinary hesitance, hematuria, vaginal or penile discharge, polyuria, sexual difficulty, lumps in testicle or breasts MSK: Negative for muscle cramps or aches, joint pain or swelling Neuro: Negative for headaches, weakness, numbness, dizziness, passing out/fainting Psych: Negative for depression, anxiety, memory problems  Objective  Physical Exam Vitals:   05/22/22 1154  BP: 118/76  Pulse: 75  Temp: 97.8 F (36.6 C)  SpO2: 96%    BP Readings from Last 3 Encounters:  05/22/22 118/76  02/21/21 120/80  02/20/20 120/70   Wt Readings from Last 3 Encounters:  05/22/22 122 lb 9.6 oz (55.6 kg)  02/21/21 124 lb 3.2 oz (56.3 kg)  01/17/21 126 lb (57.2 kg)    Physical Exam Constitutional:      General: She is not in acute distress.    Appearance: She is not diaphoretic.  HENT:     Head: Normocephalic and atraumatic.  Cardiovascular:     Rate and Rhythm: Normal rate and regular rhythm.     Heart sounds: Normal heart sounds.   Pulmonary:     Effort: Pulmonary effort is normal.     Breath sounds: Normal breath sounds.  Abdominal:     General: Bowel sounds are normal. There is no distension.     Palpations: Abdomen is soft.     Tenderness: There is no abdominal tenderness.  Musculoskeletal:     Right lower leg: No edema.     Left lower leg: No edema.  Lymphadenopathy:     Cervical: No cervical adenopathy.  Skin:    General: Skin is warm and dry.  Neurological:     Mental Status: She is alert.  Psychiatric:        Mood and Affect: Mood normal.  Lesion with irregular borders, there are a couple of different shades of brown to the lesion  Diabetic Foot Exam - Simple   Simple Foot Form Diabetic Foot exam was performed with the following findings: Yes 05/22/2022 12:18 PM  Visual Inspection No deformities, no ulcerations, no other skin breakdown bilaterally: Yes Sensation Testing Intact to touch and monofilament testing bilaterally: Yes Pulse Check Posterior Tibialis and Dorsalis pulse intact bilaterally: Yes Comments      Assessment/Plan:   Problem List Items Addressed This Visit     Breast cancer screening   Relevant Orders   MM 3D SCREEN BREAST BILATERAL   Encounter for general adult medical examination with abnormal findings - Primary    Physical exam completed.  I encouraged healthy diet and exercise.  She will call to schedule her mammogram.  We discussed getting a bone density scan though she defers this at this time.  She understands the purpose of the bone density scan.  Discussed tetanus vaccine, Shingrix vaccine, and pneumonia vaccines.  She defers these at this time as well.  She understands the purpose of these.  Lab work as outlined.      HLD (hyperlipidemia)   Relevant Orders   Comp Met (CMET)   Lipid panel   Hyperpigmented skin lesion    The lesion is small that does seem to have some asymmetry and irregular borders.  She does have an appointment with dermatology in about a  month though we will contact them to see if she can get in sooner given the mild concern that this could be a small melanoma.      Type 2 diabetes mellitus without complication, without long-term current use of insulin (HCC)   Relevant Orders   HgB A1c   Vitamin D deficiency   Relevant Orders   Vitamin D (25 hydroxy)    Return in about 6 months (around 11/22/2022).   Dominique Rumps, MD [place]

## 2022-05-22 NOTE — Assessment & Plan Note (Signed)
Physical exam completed.  I encouraged healthy diet and exercise.  She will call to schedule her mammogram.  We discussed getting a bone density scan though she defers this at this time.  She understands the purpose of the bone density scan.  Discussed tetanus vaccine, Shingrix vaccine, and pneumonia vaccines.  She defers these at this time as well.  She understands the purpose of these.  Lab work as outlined.

## 2022-05-22 NOTE — Assessment & Plan Note (Signed)
The lesion is small that does seem to have some asymmetry and irregular borders.  She does have an appointment with dermatology in about a month though we will contact them to see if she can get in sooner given the mild concern that this could be a small melanoma.

## 2022-05-22 NOTE — Progress Notes (Signed)
Needs referral for mammogram...only had crackers at 7am..Marland Kitchen

## 2022-05-22 NOTE — Telephone Encounter (Signed)
Called Dr Kellie Moor office and they were able to reschedule her appointment for Aug 3rd at 11am. Patient is aware of new appointment

## 2022-05-22 NOTE — Telephone Encounter (Signed)
Can you call dermatology (Dr. Gershon Crane office) and see if they might be able to see this patient sooner than her scheduled visit in August?  The patient has a hyperpigmented skin lesion on her left cheek that has some irregular borders and asymmetrical color that is concerning for a possible melanoma though it is quite small at a few mm in diameter.  If they need me to speak with somebody they can.  There is a picture of the lesion in the chart if they have access to epic.

## 2022-05-22 NOTE — Patient Instructions (Signed)
Nice to see you. We will contact your dermatologist and see if they can get you in sooner.  If you do not hear from them or [place] sometime this week please let us know. We will contact you with your lab results.

## 2022-05-25 ENCOUNTER — Other Ambulatory Visit: Payer: Self-pay

## 2022-05-25 DIAGNOSIS — E559 Vitamin D deficiency, unspecified: Secondary | ICD-10-CM

## 2022-06-01 DIAGNOSIS — C4339 Malignant melanoma of other parts of face: Secondary | ICD-10-CM | POA: Diagnosis not present

## 2022-06-01 DIAGNOSIS — D2262 Melanocytic nevi of left upper limb, including shoulder: Secondary | ICD-10-CM | POA: Diagnosis not present

## 2022-06-01 DIAGNOSIS — D2261 Melanocytic nevi of right upper limb, including shoulder: Secondary | ICD-10-CM | POA: Diagnosis not present

## 2022-06-01 DIAGNOSIS — D485 Neoplasm of uncertain behavior of skin: Secondary | ICD-10-CM | POA: Diagnosis not present

## 2022-06-01 DIAGNOSIS — Z85828 Personal history of other malignant neoplasm of skin: Secondary | ICD-10-CM | POA: Diagnosis not present

## 2022-06-01 DIAGNOSIS — D2271 Melanocytic nevi of right lower limb, including hip: Secondary | ICD-10-CM | POA: Diagnosis not present

## 2022-06-16 ENCOUNTER — Other Ambulatory Visit: Payer: Self-pay | Admitting: Family Medicine

## 2022-06-20 DIAGNOSIS — C4339 Malignant melanoma of other parts of face: Secondary | ICD-10-CM | POA: Diagnosis not present

## 2022-06-20 DIAGNOSIS — L988 Other specified disorders of the skin and subcutaneous tissue: Secondary | ICD-10-CM | POA: Diagnosis not present

## 2022-06-20 DIAGNOSIS — L814 Other melanin hyperpigmentation: Secondary | ICD-10-CM | POA: Diagnosis not present

## 2022-06-20 DIAGNOSIS — L578 Other skin changes due to chronic exposure to nonionizing radiation: Secondary | ICD-10-CM | POA: Diagnosis not present

## 2022-07-10 ENCOUNTER — Telehealth: Payer: Self-pay | Admitting: Family Medicine

## 2022-07-10 NOTE — Telephone Encounter (Signed)
Patient is requesting a refill on her atenolol (TENORMIN) 50 MG tablet

## 2022-07-11 ENCOUNTER — Other Ambulatory Visit: Payer: Self-pay

## 2022-07-11 DIAGNOSIS — I10 Essential (primary) hypertension: Secondary | ICD-10-CM

## 2022-07-11 MED ORDER — ATENOLOL 50 MG PO TABS: 50.0000 mg | ORAL_TABLET | Freq: Every day | ORAL | 3 refills | Status: DC

## 2022-07-11 NOTE — Telephone Encounter (Signed)
Medication sent to pharmacy per patient request.  Kyel Purk,cma  

## 2022-08-25 ENCOUNTER — Other Ambulatory Visit: Payer: Medicare PPO

## 2022-10-25 IMAGING — MG MM DIGITAL SCREENING BILAT W/ TOMO AND CAD
8 series · 9 of 24 positions shown · non-contrast
Comparison: Previous exam(s).

CLINICAL DATA: Screening.

EXAM:
DIGITAL SCREENING BILATERAL MAMMOGRAM WITH TOMOSYNTHESIS AND CAD
TECHNIQUE: Bilateral screening digital craniocaudal and mediolateral oblique
mammograms were obtained. Bilateral screening digital breast
tomosynthesis was performed. The images were evaluated with
computer-aided detection.

[L CC synth-2D]
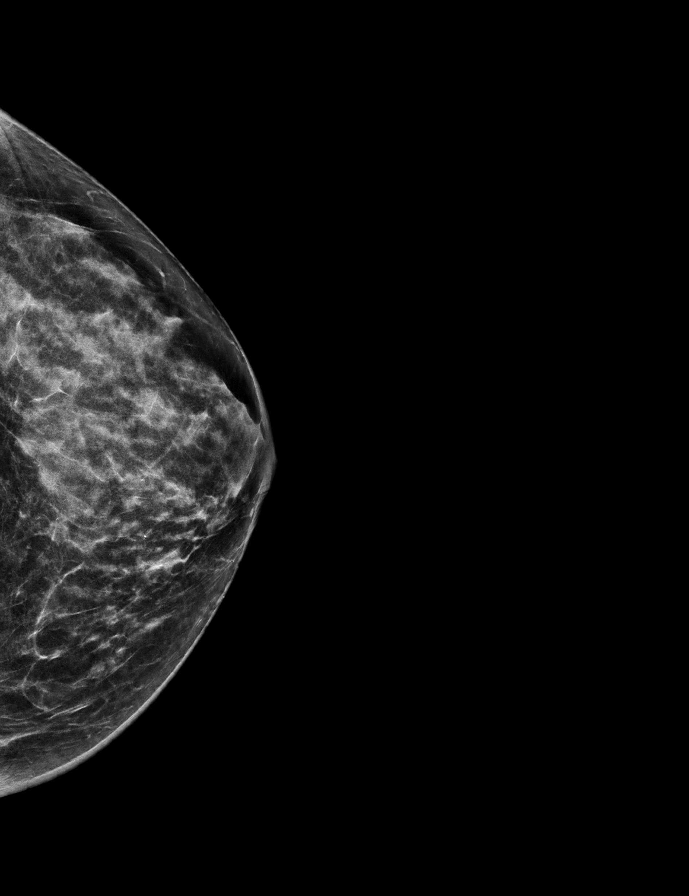

[R MLO synth-2D]
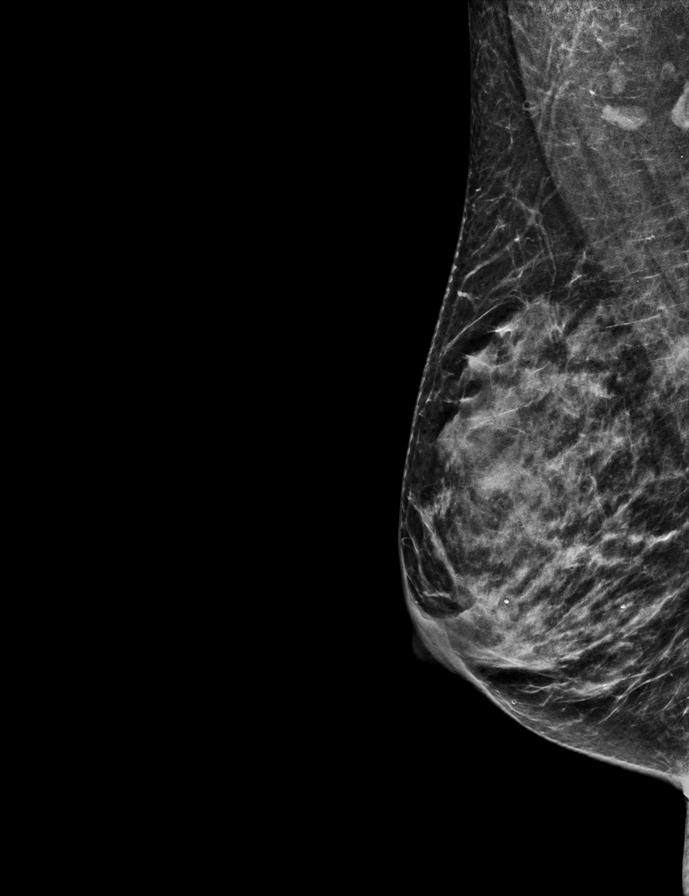

[L MLO synth-2D]
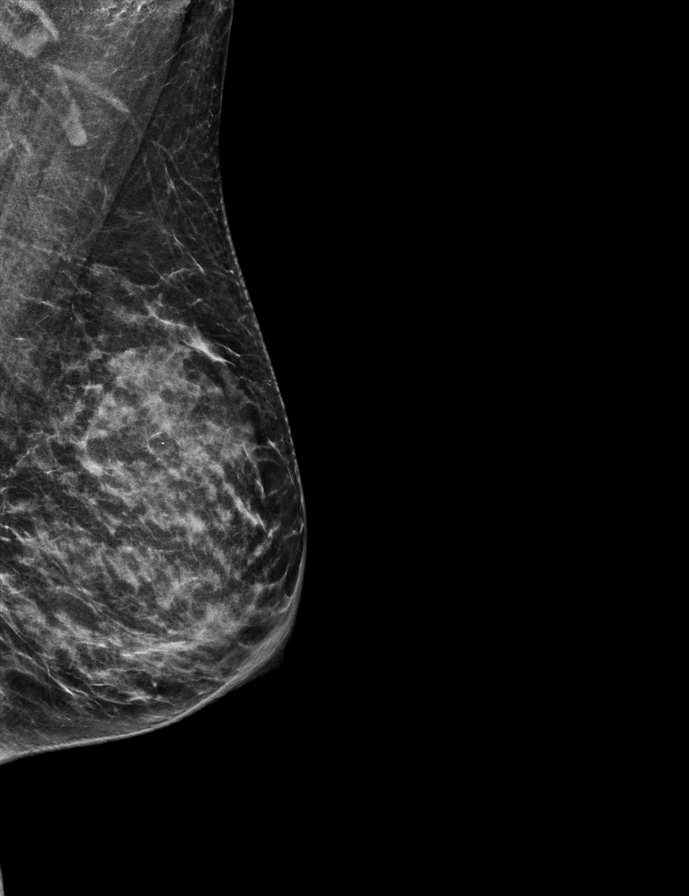

[R CC synth-2D]
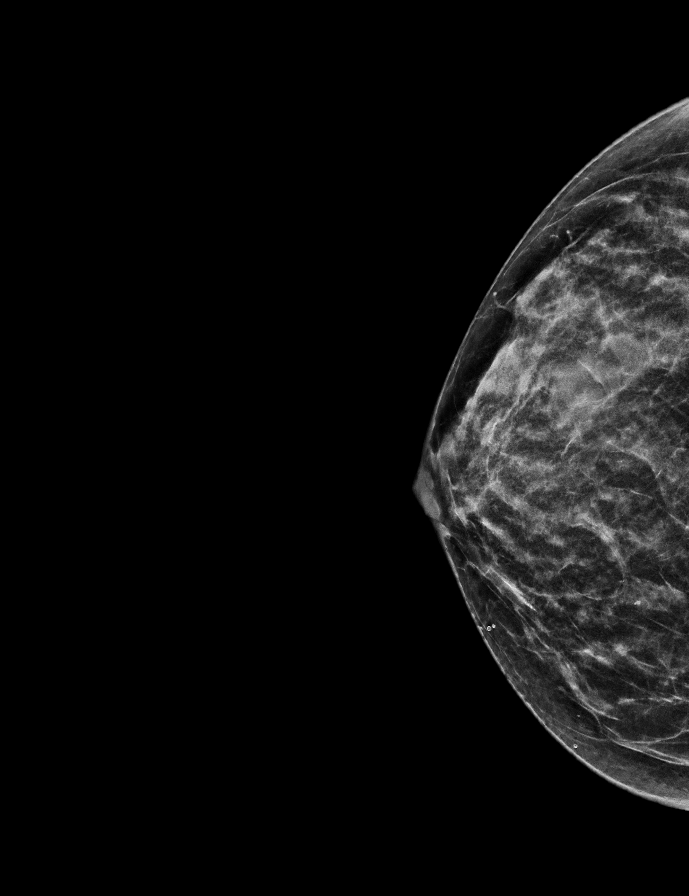

[R CC tomo · 2 of 43 frames shown]
[frame 14/43]
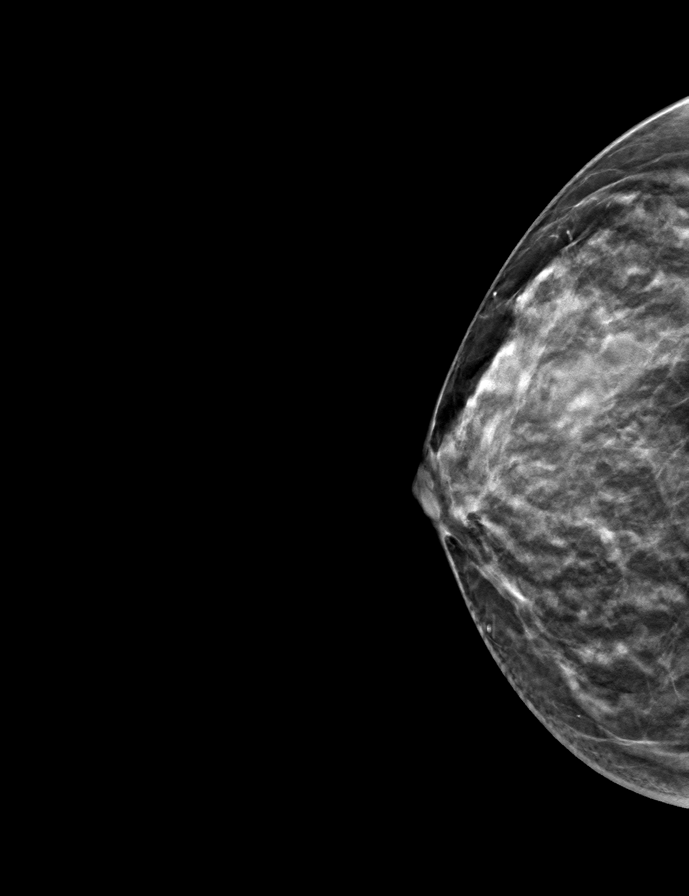
[frame 22/43]
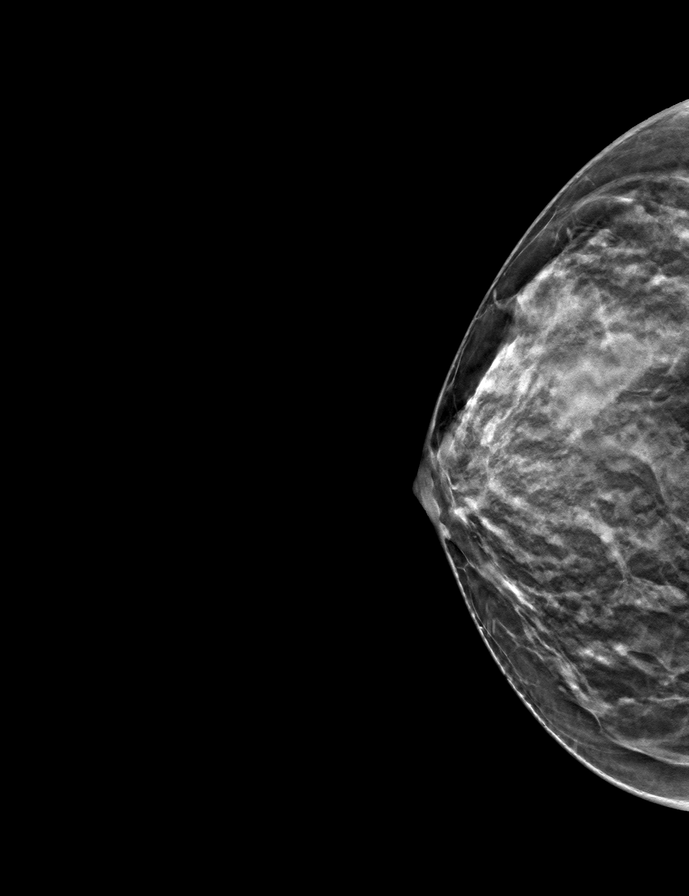

[L MLO tomo · tomo slice 26/51.0]
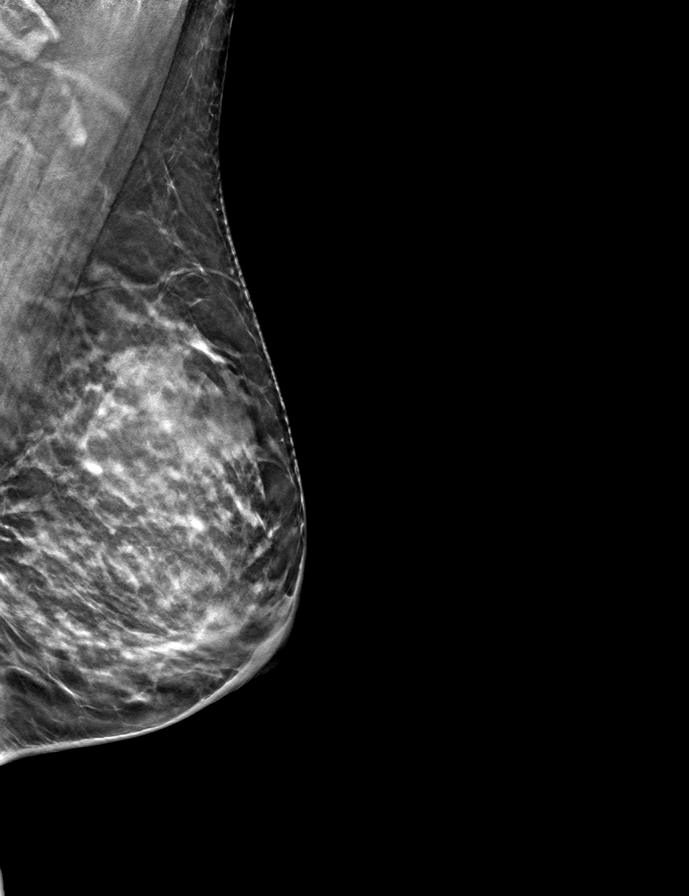

[R MLO tomo · tomo slice 25/48.0]
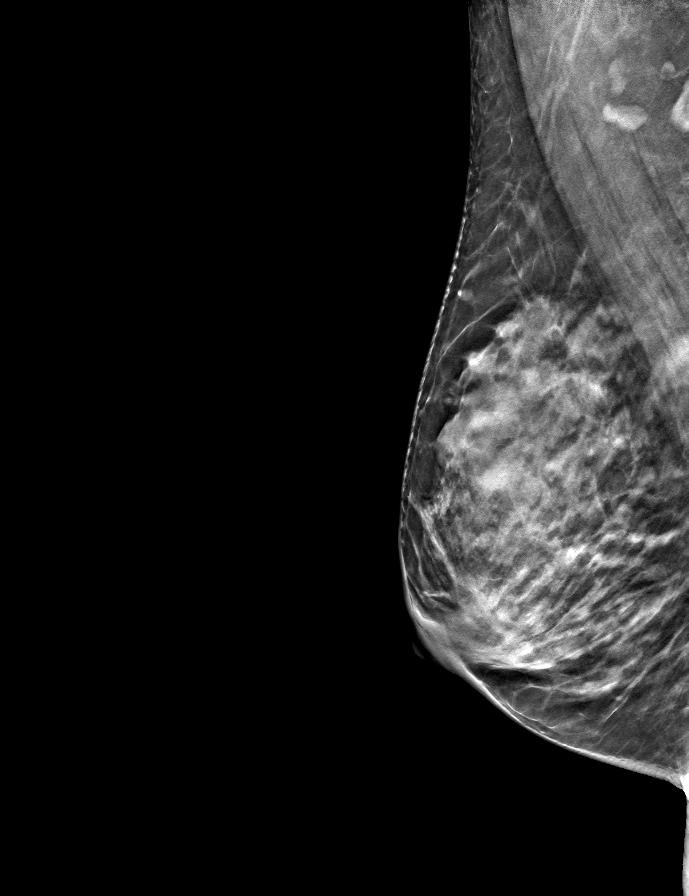

[L CC tomo · tomo slice 27/52.0]
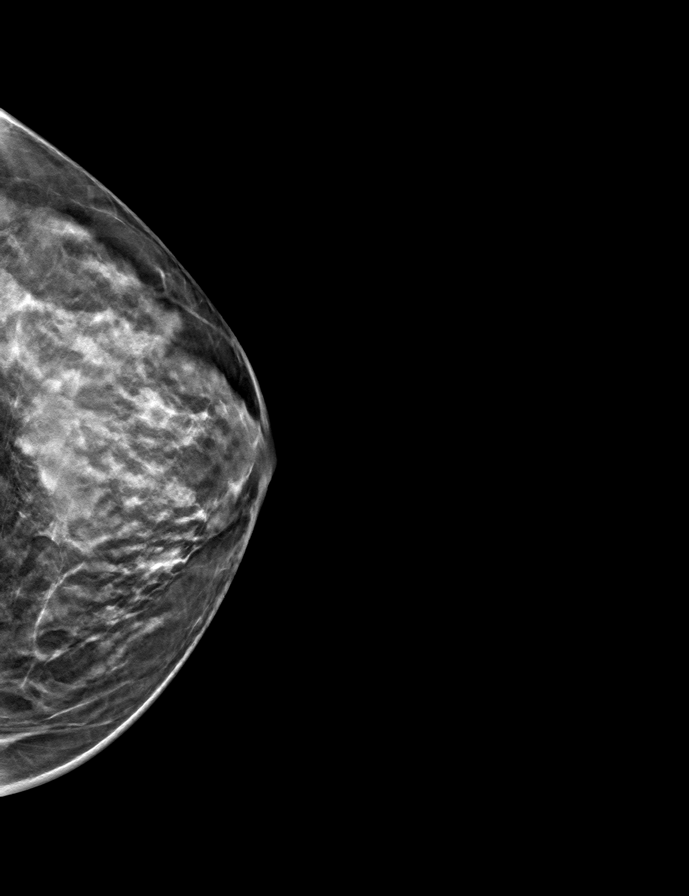

[9 of 24 positions shown; findings below may reference images not displayed]

ACR Breast Density Category c: The breast tissue is heterogeneously
dense, which may obscure small masses.
FINDINGS: There are no findings suspicious for malignancy. The images were
evaluated with computer-aided detection.
IMPRESSION: No mammographic evidence of malignancy. A result letter of this
screening mammogram will be mailed directly to the patient.

RECOMMENDATION:
Screening mammogram in one year. (Code:T4-5-GWO)

BI-RADS CATEGORY  1: Negative.

## 2022-11-08 DIAGNOSIS — D2239 Melanocytic nevi of other parts of face: Secondary | ICD-10-CM | POA: Diagnosis not present

## 2022-11-08 DIAGNOSIS — D485 Neoplasm of uncertain behavior of skin: Secondary | ICD-10-CM | POA: Diagnosis not present

## 2022-11-08 DIAGNOSIS — D225 Melanocytic nevi of trunk: Secondary | ICD-10-CM | POA: Diagnosis not present

## 2022-11-08 DIAGNOSIS — D2262 Melanocytic nevi of left upper limb, including shoulder: Secondary | ICD-10-CM | POA: Diagnosis not present

## 2022-11-08 DIAGNOSIS — L814 Other melanin hyperpigmentation: Secondary | ICD-10-CM | POA: Diagnosis not present

## 2022-11-08 DIAGNOSIS — D0359 Melanoma in situ of other part of trunk: Secondary | ICD-10-CM | POA: Diagnosis not present

## 2022-11-08 DIAGNOSIS — R202 Paresthesia of skin: Secondary | ICD-10-CM | POA: Diagnosis not present

## 2022-11-08 DIAGNOSIS — D2261 Melanocytic nevi of right upper limb, including shoulder: Secondary | ICD-10-CM | POA: Diagnosis not present

## 2022-11-22 ENCOUNTER — Ambulatory Visit: Payer: Medicare PPO | Admitting: Family Medicine

## 2022-11-22 ENCOUNTER — Encounter: Payer: Self-pay | Admitting: Family Medicine

## 2022-11-22 VITALS — BP 118/80 | HR 76 | Temp 98.0°F | Ht 65.0 in | Wt 125.6 lb

## 2022-11-22 DIAGNOSIS — E785 Hyperlipidemia, unspecified: Secondary | ICD-10-CM | POA: Diagnosis not present

## 2022-11-22 DIAGNOSIS — E559 Vitamin D deficiency, unspecified: Secondary | ICD-10-CM | POA: Diagnosis not present

## 2022-11-22 DIAGNOSIS — C439 Malignant melanoma of skin, unspecified: Secondary | ICD-10-CM | POA: Insufficient documentation

## 2022-11-22 DIAGNOSIS — R252 Cramp and spasm: Secondary | ICD-10-CM

## 2022-11-22 DIAGNOSIS — I10 Essential (primary) hypertension: Secondary | ICD-10-CM | POA: Diagnosis not present

## 2022-11-22 DIAGNOSIS — E119 Type 2 diabetes mellitus without complications: Secondary | ICD-10-CM | POA: Diagnosis not present

## 2022-11-22 HISTORY — DX: Cramp and spasm: R25.2

## 2022-11-22 LAB — MICROALBUMIN / CREATININE URINE RATIO
Creatinine,U: 106.5 mg/dL
Microalb Creat Ratio: 4.4 mg/g (ref 0.0–30.0)
Microalb, Ur: 4.6 mg/dL — ABNORMAL HIGH (ref 0.0–1.9)

## 2022-11-22 LAB — BASIC METABOLIC PANEL
BUN: 19 mg/dL (ref 6–23)
CO2: 25 mEq/L (ref 19–32)
Calcium: 9.2 mg/dL (ref 8.4–10.5)
Chloride: 106 mEq/L (ref 96–112)
Creatinine, Ser: 0.9 mg/dL (ref 0.40–1.20)
GFR: 63.64 mL/min (ref >60.00)
Glucose, Bld: 100 mg/dL — ABNORMAL HIGH (ref 70–99)
Potassium: 4 mEq/L (ref 3.5–5.1)
Sodium: 139 mEq/L (ref 135–145)

## 2022-11-22 LAB — VITAMIN D 25 HYDROXY (VIT D DEFICIENCY, FRACTURES): VITD: 24.34 ng/mL — ABNORMAL LOW (ref 30.00–100.00)

## 2022-11-22 LAB — MAGNESIUM: Magnesium: 1.7 mg/dL (ref 1.5–2.5)

## 2022-11-22 LAB — HEMOGLOBIN A1C: Hgb A1c MFr Bld: 6.6 % — ABNORMAL HIGH (ref 4.6–6.5)

## 2022-11-22 NOTE — Patient Instructions (Addendum)
Nice to see you. We will get labs today and contact you with the results.  Please check your BP daily for 2 weeks and send me your readings.

## 2022-11-22 NOTE — Assessment & Plan Note (Signed)
Chronic issue.  She will continue fenofibrate 135 mg daily.  Last triglycerides were acceptably controlled.

## 2022-11-22 NOTE — Assessment & Plan Note (Addendum)
Chronic issue.  Generally at goal at home though she is not checking frequently.  I have asked her to check daily for the next 2 weeks and send me her readings.  She will continue atenolol 50 mg daily.  Discussed a goal of less than 130/80.

## 2022-11-22 NOTE — Assessment & Plan Note (Signed)
Chronic issue.  Check A1c.  Continue to exercise and monitor her diet.

## 2022-11-22 NOTE — Assessment & Plan Note (Signed)
She will continue to follow with her dermatologist.

## 2022-11-22 NOTE — Progress Notes (Signed)
Tommi Rumps, MD Phone: [phone]  Dominique James is a 72 y.o. female who presents today for f/u.  HYPERTENSION Disease Monitoring Home BP Monitoring generally <130/80, though not checking daily Chest pain- no    Dyspnea- no Medications Compliance-  taking atenolol.  Edema- no BMET    Component Value Date/Time   NA 140 05/22/2022 1234   K 4.1 05/22/2022 1234   CL 105 05/22/2022 1234   CO2 26 05/22/2022 1234   GLUCOSE 92 05/22/2022 1234   BUN 23 05/22/2022 1234   CREATININE 1.04 05/22/2022 1234   CALCIUM 9.6 05/22/2022 1234   GFRNONAA 56.11 05/11/2010 0844   Hyperlipidemia: Taking fenofibrate.  No right quadrant pain or myalgias.  Diabetes: Patient eats out some.  Does not eat a lot of meat.  Does get fruits and vegetables.  She walks for exercise.  Leg cramps: She has these 1 time a month.  She not drinking enough water and she does not stretch.  Her husband passed away last 09/17/23.  She notes she is at a good place with this.  He had been quite sick for 5 years.  She notes no depression.  Melanoma: She was recently diagnosed with this again.  She follows up with her dermatologist in February to have this completely excised.   Social History   Tobacco Use  Smoking Status Never  Smokeless Tobacco Never    Current Outpatient Medications on File Prior to Visit  Medication Sig Dispense Refill   atenolol (TENORMIN) 50 MG tablet Take 1 tablet (50 mg total) by mouth daily. 90 tablet 3   Choline Fenofibrate (FENOFIBRIC ACID) 135 MG CPDR TAKE 1 CAPSULE BY MOUTH DAILY. 90 capsule 3   No current facility-administered medications on file prior to visit.     ROS see history of present illness  Objective  Physical Exam Vitals:   11/22/22 0847  BP: 118/80  Pulse: 76  Temp: 98 F (36.7 C)  SpO2: 97%    BP Readings from Last 3 Encounters:  11/22/22 118/80  05/22/22 118/76  02/21/21 120/80   Wt Readings from Last 3 Encounters:  11/22/22 125 lb 9.6 oz  (57 kg)  05/22/22 122 lb 9.6 oz (55.6 kg)  02/21/21 124 lb 3.2 oz (56.3 kg)    Physical Exam Constitutional:      General: She is not in acute distress.    Appearance: She is not diaphoretic.  Cardiovascular:     Rate and Rhythm: Normal rate and regular rhythm.     Heart sounds: Normal heart sounds.  Pulmonary:     Effort: Pulmonary effort is normal.     Breath sounds: Normal breath sounds.  Musculoskeletal:     Right lower leg: No edema.     Left lower leg: No edema.  Skin:    General: Skin is warm and dry.  Neurological:     Mental Status: She is alert.      Assessment/Plan: Please see individual problem list.  HYPERTENSION, BENIGN ESSENTIAL Assessment & Plan: Chronic issue.  Generally at goal at home though she is not checking frequently.  I have asked her to check daily for the next 2 weeks and send me her readings.  She will continue atenolol 50 mg daily.  Discussed a goal of less than 130/80.  Orders: -     Basic metabolic panel  Type 2 diabetes mellitus without complication, without long-term current use of insulin (HCC) Assessment & Plan: Chronic issue.  Check A1c.  Continue to  exercise and monitor her diet.  Orders: -     Microalbumin / creatinine urine ratio -     Hemoglobin A1c  Vitamin D deficiency Assessment & Plan: Check vitamin D today.  Orders: -     VITAMIN D 25 Hydroxy (Vit-D Deficiency, Fractures)  Hyperlipidemia, unspecified hyperlipidemia type Assessment & Plan: Chronic issue.  She will continue fenofibrate 135 mg daily.  Last triglycerides were acceptably controlled.   Muscle cramps Assessment & Plan: I encouraged adequate hydration and stretching.  Will check electrolytes today.  Orders: -     Magnesium  Melanoma of skin ([place]) Assessment & Plan: She will continue to follow with her dermatologist.      Health Maintenance: The patient is scheduled for her mammogram.  Return in about 6 months (around 05/23/2023).   Tommi Rumps, MD [place]

## 2022-11-22 NOTE — Assessment & Plan Note (Signed)
Check vitamin D today. 

## 2022-11-22 NOTE — Assessment & Plan Note (Signed)
I encouraged adequate hydration and stretching.  Will check electrolytes today.

## 2022-11-28 DIAGNOSIS — D0359 Melanoma in situ of other part of trunk: Secondary | ICD-10-CM | POA: Diagnosis not present

## 2022-12-13 ENCOUNTER — Ambulatory Visit
Admission: RE | Admit: 2022-12-13 | Discharge: 2022-12-13 | Disposition: A | Discharge status: Home / Self Care | Payer: Medicare PPO | Source: Ambulatory Visit | Attending: Family Medicine | Admitting: Family Medicine

## 2022-12-13 DIAGNOSIS — Z1231 Encounter for screening mammogram for malignant neoplasm of breast: Secondary | ICD-10-CM | POA: Insufficient documentation

## 2023-02-21 DIAGNOSIS — H52223 Regular astigmatism, bilateral: Secondary | ICD-10-CM | POA: Diagnosis not present

## 2023-02-21 DIAGNOSIS — H01006 Unspecified blepharitis left eye, unspecified eyelid: Secondary | ICD-10-CM | POA: Diagnosis not present

## 2023-02-21 DIAGNOSIS — Z135 Encounter for screening for eye and ear disorders: Secondary | ICD-10-CM | POA: Diagnosis not present

## 2023-02-21 DIAGNOSIS — H524 Presbyopia: Secondary | ICD-10-CM | POA: Diagnosis not present

## 2023-02-21 DIAGNOSIS — H2513 Age-related nuclear cataract, bilateral: Secondary | ICD-10-CM | POA: Diagnosis not present

## 2023-02-21 DIAGNOSIS — H01003 Unspecified blepharitis right eye, unspecified eyelid: Secondary | ICD-10-CM | POA: Diagnosis not present

## 2023-02-21 DIAGNOSIS — H5203 Hypermetropia, bilateral: Secondary | ICD-10-CM | POA: Diagnosis not present

## 2023-05-23 ENCOUNTER — Encounter: Payer: Self-pay | Admitting: Family Medicine

## 2023-05-23 ENCOUNTER — Ambulatory Visit: Payer: Medicare PPO | Admitting: Family Medicine

## 2023-05-23 VITALS — BP 140/80 | HR 80 | Temp 97.7°F | Ht 65.0 in | Wt 120.0 lb

## 2023-05-23 DIAGNOSIS — G629 Polyneuropathy, unspecified: Secondary | ICD-10-CM | POA: Diagnosis not present

## 2023-05-23 DIAGNOSIS — E785 Hyperlipidemia, unspecified: Secondary | ICD-10-CM

## 2023-05-23 DIAGNOSIS — I10 Essential (primary) hypertension: Secondary | ICD-10-CM

## 2023-05-23 DIAGNOSIS — E119 Type 2 diabetes mellitus without complications: Secondary | ICD-10-CM | POA: Diagnosis not present

## 2023-05-23 DIAGNOSIS — E559 Vitamin D deficiency, unspecified: Secondary | ICD-10-CM

## 2023-05-23 LAB — LIPID PANEL
Cholesterol: 207 mg/dL — ABNORMAL HIGH (ref 0–200)
HDL: 39.2 mg/dL (ref >39.00)
NonHDL: 168.11
Total CHOL/HDL Ratio: 5
Triglycerides: 252 mg/dL — ABNORMAL HIGH (ref 0.0–149.0)
VLDL: 50.4 mg/dL — ABNORMAL HIGH (ref 0.0–40.0)

## 2023-05-23 LAB — COMPREHENSIVE METABOLIC PANEL
ALT: 10 U/L (ref 0–35)
AST: 15 U/L (ref 0–37)
Albumin: 4.2 g/dL (ref 3.5–5.2)
Alkaline Phosphatase: 97 U/L (ref 39–117)
BUN: 15 mg/dL (ref 6–23)
CO2: 24 mEq/L (ref 19–32)
Calcium: 9.7 mg/dL (ref 8.4–10.5)
Chloride: 104 mEq/L (ref 96–112)
Creatinine, Ser: 0.91 mg/dL (ref 0.40–1.20)
GFR: 62.58 mL/min (ref >60.00)
Glucose, Bld: 110 mg/dL — ABNORMAL HIGH (ref 70–99)
Potassium: 4 mEq/L (ref 3.5–5.1)
Sodium: 139 mEq/L (ref 135–145)
Total Bilirubin: 0.6 mg/dL (ref 0.2–1.2)
Total Protein: 7.5 g/dL (ref 6.0–8.3)

## 2023-05-23 LAB — HEMOGLOBIN A1C: Hgb A1c MFr Bld: 6.1 % (ref 4.6–6.5)

## 2023-05-23 LAB — LDL CHOLESTEROL, DIRECT: Direct LDL: 126 mg/dL

## 2023-05-23 LAB — VITAMIN D 25 HYDROXY (VIT D DEFICIENCY, FRACTURES): VITD: 32.71 ng/mL (ref 30.00–100.00)

## 2023-05-23 MED ORDER — ATENOLOL 50 MG PO TABS: 50.0000 mg | ORAL_TABLET | Freq: Every day | ORAL | 3 refills | Status: DC

## 2023-05-23 NOTE — Assessment & Plan Note (Addendum)
Chronic issue.  BP at home seems to be generally well-controlled.  She will continue atenolol 50 mg daily. Encouraged to take today.

## 2023-05-23 NOTE — Progress Notes (Signed)
Marikay Alar, MD Phone: [phone]  Dominique James is a 72 y.o. female who presents today for f/u.  HYPERTENSION Disease Monitoring Home BP Monitoring generally <130/80 Chest pain- no    Dyspnea- no Medications Compliance-  taking atenolol.  Has not taken medication today.  Edema- no BMET    Component Value Date/Time   NA 139 11/22/2022 0913   K 4.0 11/22/2022 0913   CL 106 11/22/2022 0913   CO2 25 11/22/2022 0913   GLUCOSE 100 (H) 11/22/2022 0913   BUN 19 11/22/2022 0913   CREATININE 0.90 11/22/2022 0913   CALCIUM 9.2 11/22/2022 0913   GFRNONAA 56.11 05/11/2010 0844   HYPERLIPIDEMIA Symptoms Chest pain on exertion:  no    Medications: Compliance- taking no medication Right upper quadrant pain- no  Muscle aches- no Lipid Panel     Component Value Date/Time   CHOL 185 05/22/2022 1234   TRIG 121.0 05/22/2022 1234   HDL 42.70 05/22/2022 1234   CHOLHDL 4 05/22/2022 1234   VLDL 24.2 05/22/2022 1234   LDLCALC 118 (H) 05/22/2022 1234   LDLDIRECT 148.0 05/28/2018 1442   Diabetes: Patient notes no polyuria or polydipsia.  She has been going to the gym and doing cardio Monday and Friday and doing yoga on Wednesdays.  She has been limiting her sugar intake and drinking minimal sodas.  Getting plenty of fruits and vegetables.  She has lost some weight.  She does report some odd sensation in her feet at night.  Social History   Tobacco Use  Smoking Status Never  Smokeless Tobacco Never    Current Outpatient Medications on File Prior to Visit  Medication Sig Dispense Refill   cholecalciferol (VITAMIN D3) 25 MCG (1000 UNIT) tablet Take 1,000 Units by mouth daily.     Choline Fenofibrate (FENOFIBRIC ACID) 135 MG CPDR TAKE 1 CAPSULE BY MOUTH DAILY. 90 capsule 3   No current facility-administered medications on file prior to visit.     ROS see history of present illness  Objective  Physical Exam Vitals:   05/23/23 0849 05/23/23 0907  BP: (!) 140/78 (!) 140/80   Pulse: 80   Temp: 97.7 F (36.5 C)   SpO2: 98%     BP Readings from Last 3 Encounters:  05/23/23 (!) 140/80  11/22/22 118/80  05/22/22 118/76   Wt Readings from Last 3 Encounters:  05/23/23 120 lb (54.4 kg)  11/22/22 125 lb 9.6 oz (57 kg)  05/22/22 122 lb 9.6 oz (55.6 kg)    Physical Exam Constitutional:      General: She is not in acute distress.    Appearance: She is not diaphoretic.  Cardiovascular:     Rate and Rhythm: Normal rate and regular rhythm.     Heart sounds: Normal heart sounds.  Pulmonary:     Effort: Pulmonary effort is normal.     Breath sounds: Normal breath sounds.  Musculoskeletal:     Right lower leg: No edema.     Left lower leg: No edema.  Skin:    General: Skin is warm and dry.  Neurological:     Mental Status: She is alert.      Assessment/Plan: Please see individual problem list.  Type 2 diabetes mellitus without complication, without long-term current use of insulin (HCC) Assessment & Plan: Chronic issue.  Continue to work on diet and exercise.  Check labs today.  Orders: -     Hemoglobin A1c  HYPERTENSION, BENIGN ESSENTIAL Assessment & Plan: Chronic issue.  BP  at home seems to be generally well-controlled.  She will continue atenolol 50 mg daily. Encouraged to take today.   Orders: -     Atenolol; Take 1 tablet (50 mg total) by mouth daily.  Dispense: 90 tablet; Refill: 3  Hyperlipidemia, unspecified hyperlipidemia type Assessment & Plan: Chronic issue.  Patient reports she has only had a bite of an apple today.  We will check lipid panel.  Discussed the potential for going on additional medication for this.  Orders: -     Lipid panel -     Comprehensive metabolic panel  Neuropathy Assessment & Plan: Suspect symptoms in feet are related to neuropathy.  She will monitor given that they are mild.  Discussed getting her glucose under adequate control.   Vitamin D deficiency -     VITAMIN D 25 Hydroxy (Vit-D Deficiency,  Fractures)    Return in about 6 months (around 11/23/2023).   Marikay Alar, MD [facility]

## 2023-05-23 NOTE — Assessment & Plan Note (Signed)
Chronic issue.  Continue to work on diet and exercise.  Check labs today.

## 2023-05-23 NOTE — Assessment & Plan Note (Signed)
Suspect symptoms in feet are related to neuropathy.  She will monitor given that they are mild.  Discussed getting her glucose under adequate control.

## 2023-05-23 NOTE — Assessment & Plan Note (Signed)
Chronic issue.  Patient reports she has only had a bite of an apple today.  We will check lipid panel.  Discussed the potential for going on additional medication for this.

## 2023-05-24 DIAGNOSIS — H25013 Cortical age-related cataract, bilateral: Secondary | ICD-10-CM | POA: Diagnosis not present

## 2023-05-24 DIAGNOSIS — H18413 Arcus senilis, bilateral: Secondary | ICD-10-CM | POA: Diagnosis not present

## 2023-05-24 DIAGNOSIS — H25043 Posterior subcapsular polar age-related cataract, bilateral: Secondary | ICD-10-CM | POA: Diagnosis not present

## 2023-05-24 DIAGNOSIS — H2511 Age-related nuclear cataract, right eye: Secondary | ICD-10-CM | POA: Diagnosis not present

## 2023-05-24 DIAGNOSIS — H2513 Age-related nuclear cataract, bilateral: Secondary | ICD-10-CM | POA: Diagnosis not present

## 2023-05-25 ENCOUNTER — Telehealth: Payer: Self-pay

## 2023-05-28 ENCOUNTER — Telehealth: Payer: Self-pay

## 2023-05-28 NOTE — Telephone Encounter (Signed)
Patient returned phone call and note was read. She needs to think about starting

## 2023-05-28 NOTE — Telephone Encounter (Signed)
Left message to call the office back regarding the lab results below.

## 2023-05-28 NOTE — Telephone Encounter (Signed)
-----   Message from Marikay Alar sent at 05/25/2023  3:59 PM EDT ----- Please let the patient know that her A1c has improved down to 6.1.  This is adequately controlled with diet and exercise changes.  Her cholesterol has gone up.  I would recommend starting a statin such as Crestor 20 mg daily.  If she is willing to start this I can send it to her pharmacy and she would need follow-up direct LDL and hepatic function panel in 6 weeks.  Her other labs are acceptable.

## 2023-05-31 NOTE — Telephone Encounter (Signed)
Error

## 2023-06-26 DIAGNOSIS — D2261 Melanocytic nevi of right upper limb, including shoulder: Secondary | ICD-10-CM | POA: Diagnosis not present

## 2023-06-26 DIAGNOSIS — D225 Melanocytic nevi of trunk: Secondary | ICD-10-CM | POA: Diagnosis not present

## 2023-06-26 DIAGNOSIS — Z85828 Personal history of other malignant neoplasm of skin: Secondary | ICD-10-CM | POA: Diagnosis not present

## 2023-06-26 DIAGNOSIS — D485 Neoplasm of uncertain behavior of skin: Secondary | ICD-10-CM | POA: Diagnosis not present

## 2023-06-26 DIAGNOSIS — D2262 Melanocytic nevi of left upper limb, including shoulder: Secondary | ICD-10-CM | POA: Diagnosis not present

## 2023-06-26 DIAGNOSIS — L570 Actinic keratosis: Secondary | ICD-10-CM | POA: Diagnosis not present

## 2023-06-26 DIAGNOSIS — D2239 Melanocytic nevi of other parts of face: Secondary | ICD-10-CM | POA: Diagnosis not present

## 2023-06-26 DIAGNOSIS — Z85820 Personal history of malignant melanoma of skin: Secondary | ICD-10-CM | POA: Diagnosis not present

## 2023-07-01 HISTORY — PX: CATARACT EXTRACTION: SUR2

## 2023-07-30 DIAGNOSIS — Z9841 Cataract extraction status, right eye: Secondary | ICD-10-CM | POA: Diagnosis not present

## 2023-07-30 DIAGNOSIS — H25011 Cortical age-related cataract, right eye: Secondary | ICD-10-CM | POA: Diagnosis not present

## 2023-07-30 DIAGNOSIS — H2511 Age-related nuclear cataract, right eye: Secondary | ICD-10-CM | POA: Diagnosis not present

## 2023-07-30 DIAGNOSIS — Z961 Presence of intraocular lens: Secondary | ICD-10-CM | POA: Diagnosis not present

## 2023-07-30 DIAGNOSIS — H2512 Age-related nuclear cataract, left eye: Secondary | ICD-10-CM | POA: Diagnosis not present

## 2023-07-31 DIAGNOSIS — H2512 Age-related nuclear cataract, left eye: Secondary | ICD-10-CM | POA: Diagnosis not present

## 2023-08-02 DIAGNOSIS — D225 Melanocytic nevi of trunk: Secondary | ICD-10-CM | POA: Diagnosis not present

## 2023-08-13 DIAGNOSIS — H2512 Age-related nuclear cataract, left eye: Secondary | ICD-10-CM | POA: Diagnosis not present

## 2023-08-13 DIAGNOSIS — Z9841 Cataract extraction status, right eye: Secondary | ICD-10-CM | POA: Diagnosis not present

## 2023-08-13 DIAGNOSIS — H04123 Dry eye syndrome of bilateral lacrimal glands: Secondary | ICD-10-CM | POA: Diagnosis not present

## 2023-08-13 DIAGNOSIS — Z961 Presence of intraocular lens: Secondary | ICD-10-CM | POA: Diagnosis not present

## 2023-08-13 DIAGNOSIS — H25012 Cortical age-related cataract, left eye: Secondary | ICD-10-CM | POA: Diagnosis not present

## 2023-08-13 DIAGNOSIS — Z9842 Cataract extraction status, left eye: Secondary | ICD-10-CM | POA: Diagnosis not present

## 2023-09-17 DIAGNOSIS — L570 Actinic keratosis: Secondary | ICD-10-CM | POA: Diagnosis not present

## 2023-09-17 DIAGNOSIS — D2371 Other benign neoplasm of skin of right lower limb, including hip: Secondary | ICD-10-CM | POA: Diagnosis not present

## 2023-09-17 DIAGNOSIS — D485 Neoplasm of uncertain behavior of skin: Secondary | ICD-10-CM | POA: Diagnosis not present

## 2023-11-05 ENCOUNTER — Encounter: Payer: Medicare PPO | Admitting: Family Medicine

## 2023-11-13 ENCOUNTER — Encounter: Payer: Medicare PPO | Admitting: Family Medicine

## 2023-11-23 ENCOUNTER — Ambulatory Visit: Payer: Medicare PPO | Admitting: Family Medicine

## 2023-12-25 ENCOUNTER — Ambulatory Visit: Payer: Medicare PPO | Admitting: Family Medicine

## 2023-12-25 ENCOUNTER — Encounter: Payer: Self-pay | Admitting: Family Medicine

## 2023-12-25 VITALS — BP 128/74 | HR 69 | Temp 98.2°F | Resp 18 | Ht 65.0 in | Wt 119.1 lb

## 2023-12-25 DIAGNOSIS — I152 Hypertension secondary to endocrine disorders: Secondary | ICD-10-CM | POA: Diagnosis not present

## 2023-12-25 DIAGNOSIS — Z780 Asymptomatic menopausal state: Secondary | ICD-10-CM

## 2023-12-25 DIAGNOSIS — Z1211 Encounter for screening for malignant neoplasm of colon: Secondary | ICD-10-CM

## 2023-12-25 DIAGNOSIS — E118 Type 2 diabetes mellitus with unspecified complications: Secondary | ICD-10-CM

## 2023-12-25 DIAGNOSIS — E1159 Type 2 diabetes mellitus with other circulatory complications: Secondary | ICD-10-CM | POA: Diagnosis not present

## 2023-12-25 DIAGNOSIS — E785 Hyperlipidemia, unspecified: Secondary | ICD-10-CM | POA: Diagnosis not present

## 2023-12-25 DIAGNOSIS — E559 Vitamin D deficiency, unspecified: Secondary | ICD-10-CM | POA: Diagnosis not present

## 2023-12-25 DIAGNOSIS — Z1231 Encounter for screening mammogram for malignant neoplasm of breast: Secondary | ICD-10-CM

## 2023-12-25 DIAGNOSIS — H6121 Impacted cerumen, right ear: Secondary | ICD-10-CM | POA: Diagnosis not present

## 2023-12-25 DIAGNOSIS — E1169 Type 2 diabetes mellitus with other specified complication: Secondary | ICD-10-CM

## 2023-12-25 LAB — COMPREHENSIVE METABOLIC PANEL
ALT: 11 U/L (ref 0–35)
AST: 15 U/L (ref 0–37)
Albumin: 4.2 g/dL (ref 3.5–5.2)
Alkaline Phosphatase: 101 U/L (ref 39–117)
BUN: 11 mg/dL (ref 6–23)
CO2: 25 meq/L (ref 19–32)
Calcium: 9.2 mg/dL (ref 8.4–10.5)
Chloride: 103 meq/L (ref 96–112)
Creatinine, Ser: 0.81 mg/dL (ref 0.40–1.20)
GFR: 71.67 mL/min (ref >60.00)
Glucose, Bld: 108 mg/dL — ABNORMAL HIGH (ref 70–99)
Potassium: 3.6 meq/L (ref 3.5–5.1)
Sodium: 138 meq/L (ref 135–145)
Total Bilirubin: 0.5 mg/dL (ref 0.2–1.2)
Total Protein: 7.6 g/dL (ref 6.0–8.3)

## 2023-12-25 LAB — MICROALBUMIN / CREATININE URINE RATIO
Creatinine,U: 104.7 mg/dL
Microalb Creat Ratio: 12.9 mg/g (ref 0.0–30.0)
Microalb, Ur: 1.4 mg/dL (ref 0.0–1.9)

## 2023-12-25 LAB — HEMOGLOBIN A1C: Hgb A1c MFr Bld: 6.1 % (ref 4.6–6.5)

## 2023-12-25 LAB — VITAMIN D 25 HYDROXY (VIT D DEFICIENCY, FRACTURES): VITD: 36.12 ng/mL (ref 30.00–100.00)

## 2023-12-25 NOTE — Progress Notes (Signed)
 SUBJECTIVE:   Chief Complaint  Patient presents with   Establish Care    Transferring from Dr. Birdie Sons   HPI Presents to clinic to transfer care  Discussed the use of AI scribe software for clinical note transcription with the patient, who gave verbal consent to proceed.  History of Present Illness  Dominique James is a 73 year old female with hypertension who presents for transfer of care and medication review. She is transferring care from Dr. Birdie Sons.  She has been on atenolol 50 mg daily for years to manage her hypertension, which is currently well-controlled. She also takes over-the-counter vitamin D.  She has not been diagnosed with diabetes but had a previous A1c of 6.6%. She is not on any diabetes medication and manages her condition through lifestyle measures.  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  She experiences loose stools since her gallbladder removal, which she manages by being careful with her diet. No issues with urination.  She has not had any recent vaccines due to her husband's illness during the COVID-19 pandemic, but she received two COVID-19 vaccines.  Her husband passed away 15 months ago after a battle with cancer. She describes this as a 'sad thing' but notes she is doing well with support and regular gym visits three times a week, which she considers her therapy.      PERTINENT PMH / PSH: As above  OBJECTIVE:  BP 128/74   Pulse 69   Temp 98.2 F (36.8 C)   Resp 18   Ht 5\' 5"  (1.651 m)   Wt 119 lb 2 oz (54 kg)   SpO2 99%   BMI 19.82 kg/m    Physical Exam Vitals reviewed.  Constitutional:      General: She is not in acute distress.    Appearance: She is not ill-appearing.  HENT:     Head: Normocephalic.     Right Ear: Tympanic membrane, ear canal and external ear normal.     Left Ear: Tympanic membrane, ear canal and external ear normal.     Nose: Nose normal.      Mouth/Throat:     Mouth: Mucous membranes are moist.  Eyes:     Extraocular Movements: Extraocular movements intact.     Conjunctiva/sclera: Conjunctivae normal.     Pupils: Pupils are equal, round, and reactive to light.  Neck:     Thyroid: No thyromegaly or thyroid tenderness.     Vascular: No carotid bruit.  Cardiovascular:     Rate and Rhythm: Normal rate and regular rhythm.     Pulses: Normal pulses.     Heart sounds: Normal heart sounds.  Pulmonary:     Effort: Pulmonary effort is normal.     Breath sounds: Normal breath sounds.  Abdominal:     General: Bowel sounds are normal. There is no distension.     Palpations: Abdomen is soft.     Tenderness: There is no abdominal tenderness. There is no right CVA tenderness, left CVA tenderness, guarding or rebound.  Musculoskeletal:        General: Normal range of motion.     Cervical back: Normal range of motion.     Right lower leg: No edema.     Left lower leg: No edema.  Lymphadenopathy:     Cervical: No cervical adenopathy.  Skin:    Capillary Refill: Capillary refill takes less than 2 seconds.  Neurological:  General: No focal deficit present.     Mental Status: She is alert and oriented to person, place, and time. Mental status is at baseline.     Motor: No weakness.  Psychiatric:        Mood and Affect: Mood normal.        Behavior: Behavior normal.        Thought Content: Thought content normal.        Judgment: Judgment normal.           12/25/2023    1:05 PM 11/22/2022    8:49 AM 05/22/2022   11:55 AM 01/17/2021    8:56 AM 02/20/2020    1:53 PM  Depression screen PHQ 2/9  Decreased Interest 0 0 0 0 0  Down, Depressed, Hopeless 0 0 0 0 0  PHQ - 2 Score 0 0 0 0 0  Altered sleeping 0      Tired, decreased energy 0      Change in appetite 0      Feeling bad or failure about yourself  0      Trouble concentrating 0      Moving slowly or fidgety/restless 0      Suicidal thoughts 0      PHQ-9 Score 0       Difficult doing work/chores Not difficult at all          12/25/2023    1:05 PM 11/22/2022    8:49 AM 05/22/2022   11:55 AM  GAD 7 : Generalized Anxiety Score  Nervous, Anxious, on Edge 0 0 0  Control/stop worrying 0 0 0  Worry too much - different things 0 0 0  Trouble relaxing 0 0 0  Restless 0 0 0  Easily annoyed or irritable 0 0 0  Afraid - awful might happen 0 0 0  Total GAD 7 Score 0 0 0  Anxiety Difficulty Not difficult at all Not difficult at all Not difficult at all    ASSESSMENT/PLAN:  Type 2 diabetes mellitus with complications (HCC) Assessment & Plan: Previous A1c of 6.6, currently well controlled without medication. Discussed the importance of regular monitoring and lifestyle modifications. -Check A1c annually. -Continue lifestyle modifications including diet and exercise.  Orders: -     Hemoglobin A1c  Hypertension associated with diabetes (HCC) Assessment & Plan: Well controlled on Atenolol 50mg  daily. No changes to medication regimen planned. -Continue Atenolol 50mg  daily.  Orders: -     Comprehensive metabolic panel -     Microalbumin / creatinine urine ratio  Vitamin D deficiency -     VITAMIN D 25 Hydroxy (Vit-D Deficiency, Fractures)  Breast cancer screening by mammogram -     3D Screening Mammogram, Left and Right  Hyperlipidemia associated with type 2 diabetes mellitus (HCC) Assessment & Plan: Not on statin LDL not at goal < 70.  Not interested in statin -Check fasting lipids.  Patient to schedule lab appointment as not fasting today.  Orders: -     Lipid panel; Future  Impacted cerumen of right ear Assessment & Plan: Noted during physical exam. Patient reports no worsening of hearing. -Use over-the-counter ear drops (5 drops twice daily) for 5 days. -If no improvement, schedule appointment for ear irrigation.      General Health Maintenance -Order mammogram. -Discussed DEXA scan for osteoporosis screening, patient  declined. -Discussed pneumonia and RSV vaccines, patient to consider.       PDMP reviewed  Return in about 6 months (around 06/23/2024)  for PCP, DM.  Dana Allan, MD

## 2023-12-25 NOTE — Patient Instructions (Addendum)
 It was a pleasure meeting you today. Thank you for allowing me to take part in your health care.  Our goals for today as we discussed include:  We will get some labs today.  If they are abnormal or we need to do something about them, I will call you.  If they are normal, I will send you a message on MyChart (if it is active) or a letter in the mail.  If you don't hear from [place] in 2 weeks, please call the office at the number below.   Please schedule appointment for fasting blood work to have cholesterol checked at your earliest convenience.  Foot exam at next visit  Referral sent for Mammogram. Please call to schedule appointment. [facility] [address] [place], [place] [address] [phone]    Cologuard is due 12/2024   This is a list of the screening recommended for you and due dates:  Health Maintenance  Topic Date Due   DEXA scan (bone density measurement)  Never done   DTaP/Tdap/Td vaccine (2 - Tdap) 03/05/2019   Medicare Annual Wellness Visit  01/17/2022   Eye exam for diabetics  01/29/2023   Complete foot exam   05/23/2023   Yearly kidney health urinalysis for diabetes  11/23/2023   Hemoglobin A1C  11/23/2023   Mammogram  12/14/2023   Flu Shot  01/28/2024*   Zoster (Shingles) Vaccine (1 of 2) 03/23/2024*   Pneumonia Vaccine (2 of 2 - PCV) 12/24/2024*   Yearly kidney function blood test for diabetes  05/22/2024   Cologuard (Stool DNA test)  01/09/2025   Hepatitis C Screening  Completed   HPV Vaccine  Aged Out   COVID-19 Vaccine  Discontinued  *Topic was postponed. The date shown is not the original due date.    If you have any questions or concerns, please do not hesitate to call the office at [phone].  I look forward to our next visit and until then take care and stay safe.  Regards,   Dana Allan, MD   [facility]

## 2023-12-30 ENCOUNTER — Encounter: Payer: Self-pay | Admitting: Family Medicine

## 2023-12-30 DIAGNOSIS — Z780 Asymptomatic menopausal state: Secondary | ICD-10-CM | POA: Insufficient documentation

## 2023-12-30 DIAGNOSIS — Z1231 Encounter for screening mammogram for malignant neoplasm of breast: Secondary | ICD-10-CM | POA: Insufficient documentation

## 2023-12-30 DIAGNOSIS — H6120 Impacted cerumen, unspecified ear: Secondary | ICD-10-CM

## 2023-12-30 DIAGNOSIS — E1169 Type 2 diabetes mellitus with other specified complication: Secondary | ICD-10-CM | POA: Insufficient documentation

## 2023-12-30 HISTORY — DX: Impacted cerumen, unspecified ear: H61.20

## 2023-12-30 NOTE — Assessment & Plan Note (Signed)
 Noted during physical exam. Patient reports no worsening of hearing. -Use over-the-counter ear drops (5 drops twice daily) for 5 days. -If no improvement, schedule appointment for ear irrigation.

## 2023-12-30 NOTE — Assessment & Plan Note (Signed)
 Not on statin LDL not at goal < 70.  Not interested in statin -Check fasting lipids.  Patient to schedule lab appointment as not fasting today.

## 2023-12-30 NOTE — Assessment & Plan Note (Signed)
 Well controlled on Atenolol 50mg  daily. No changes to medication regimen planned. -Continue Atenolol 50mg  daily.

## 2023-12-30 NOTE — Assessment & Plan Note (Signed)
 Previous A1c of 6.6, currently well controlled without medication. Discussed the importance of regular monitoring and lifestyle modifications. -Check A1c annually. -Continue lifestyle modifications including diet and exercise.

## 2023-12-31 ENCOUNTER — Telehealth: Payer: Self-pay

## 2023-12-31 NOTE — Telephone Encounter (Signed)
-----   Message from Dana Allan sent at 12/30/2023  6:54 PM EST ----- No significant change in blood work.    Blood work acceptable

## 2023-12-31 NOTE — Telephone Encounter (Signed)
 Left message to return call to our office.  Okay to give results to pt. Document when pt is spoke to.

## 2024-01-10 ENCOUNTER — Encounter: Payer: Self-pay | Admitting: Nurse Practitioner

## 2024-01-24 ENCOUNTER — Telehealth: Payer: Self-pay | Admitting: Family Medicine

## 2024-01-24 NOTE — Telephone Encounter (Signed)
 Dr Clent Ridges is leaving the practice and your appointment on 06/27/24 has been rescheduled with another provider as a transfer of care visit on 06/23/24 at 9:30, after your already scheduled lab visit. You will be seeing our [place] [facility], Dr Skip Estimable. If there are any questions or this appointment will not work for you, please call the office at [phone] and we will reschedule you.   Thank you   E2C2, if patient calls back, please answer any questions or reschedule her as necessary. [facility]

## 2024-06-23 ENCOUNTER — Other Ambulatory Visit: Payer: Medicare PPO

## 2024-06-23 ENCOUNTER — Ambulatory Visit

## 2024-06-27 ENCOUNTER — Ambulatory Visit: Payer: Medicare PPO | Admitting: Family Medicine

## 2024-07-10 ENCOUNTER — Other Ambulatory Visit: Payer: Self-pay

## 2024-07-10 DIAGNOSIS — I10 Essential (primary) hypertension: Secondary | ICD-10-CM

## 2024-07-10 MED ORDER — ATENOLOL 50 MG PO TABS: 50.0000 mg | ORAL_TABLET | Freq: Every day | ORAL | 0 refills | Status: DC

## 2024-07-28 ENCOUNTER — Ambulatory Visit: Payer: Self-pay

## 2024-07-28 ENCOUNTER — Ambulatory Visit

## 2024-07-28 VITALS — BP 120/88 | HR 63 | Temp 97.9°F | Ht 65.5 in | Wt 116.0 lb

## 2024-07-28 DIAGNOSIS — E1159 Type 2 diabetes mellitus with other circulatory complications: Secondary | ICD-10-CM | POA: Diagnosis not present

## 2024-07-28 DIAGNOSIS — E118 Type 2 diabetes mellitus with unspecified complications: Secondary | ICD-10-CM | POA: Diagnosis not present

## 2024-07-28 DIAGNOSIS — E119 Type 2 diabetes mellitus without complications: Secondary | ICD-10-CM

## 2024-07-28 DIAGNOSIS — E1169 Type 2 diabetes mellitus with other specified complication: Secondary | ICD-10-CM

## 2024-07-28 DIAGNOSIS — Z1231 Encounter for screening mammogram for malignant neoplasm of breast: Secondary | ICD-10-CM | POA: Diagnosis not present

## 2024-07-28 DIAGNOSIS — I252 Old myocardial infarction: Secondary | ICD-10-CM

## 2024-07-28 DIAGNOSIS — I152 Hypertension secondary to endocrine disorders: Secondary | ICD-10-CM

## 2024-07-28 DIAGNOSIS — E559 Vitamin D deficiency, unspecified: Secondary | ICD-10-CM | POA: Diagnosis not present

## 2024-07-28 DIAGNOSIS — E785 Hyperlipidemia, unspecified: Secondary | ICD-10-CM

## 2024-07-28 LAB — VITAMIN D 25 HYDROXY (VIT D DEFICIENCY, FRACTURES): VITD: 30.02 ng/mL (ref 30.00–100.00)

## 2024-07-28 LAB — LIPID PANEL
Cholesterol: 232 mg/dL — ABNORMAL HIGH (ref 0–200)
HDL: 44.5 mg/dL (ref >39.00)
LDL Cholesterol: 144 mg/dL — ABNORMAL HIGH (ref 0–99)
NonHDL: 187.34
Total CHOL/HDL Ratio: 5
Triglycerides: 217 mg/dL — ABNORMAL HIGH (ref 0.0–149.0)
VLDL: 43.4 mg/dL — ABNORMAL HIGH (ref 0.0–40.0)

## 2024-07-28 LAB — COMPREHENSIVE METABOLIC PANEL WITH GFR
ALT: 12 U/L (ref 0–35)
AST: 17 U/L (ref 0–37)
Albumin: 4.3 g/dL (ref 3.5–5.2)
Alkaline Phosphatase: 98 U/L (ref 39–117)
BUN: 13 mg/dL (ref 6–23)
CO2: 28 meq/L (ref 19–32)
Calcium: 9.8 mg/dL (ref 8.4–10.5)
Chloride: 104 meq/L (ref 96–112)
Creatinine, Ser: 0.78 mg/dL (ref 0.40–1.20)
GFR: 74.67 mL/min (ref >60.00)
Glucose, Bld: 104 mg/dL — ABNORMAL HIGH (ref 70–99)
Potassium: 4.5 meq/L (ref 3.5–5.1)
Sodium: 140 meq/L (ref 135–145)
Total Bilirubin: 0.6 mg/dL (ref 0.2–1.2)
Total Protein: 7.2 g/dL (ref 6.0–8.3)

## 2024-07-28 LAB — HEMOGLOBIN A1C: Hgb A1c MFr Bld: 6.3 % (ref 4.6–6.5)

## 2024-07-28 MED ORDER — ATENOLOL 50 MG PO TABS: 50.0000 mg | ORAL_TABLET | Freq: Every day | ORAL | 3 refills | Status: AC

## 2024-07-28 MED ORDER — ASPIRIN 81 MG PO TBEC: 81.0000 mg | DELAYED_RELEASE_TABLET | Freq: Every day | ORAL | Status: AC

## 2024-07-28 MED ORDER — PRAVASTATIN SODIUM 10 MG PO TABS: 10.0000 mg | ORAL_TABLET | Freq: Every day | ORAL | 3 refills | Status: AC

## 2024-07-28 NOTE — Assessment & Plan Note (Signed)
 Diet controlled type II DM. Will repeat A1c today.  Continue annual eye exam.  Patient's urine microalbumin normal on 12/2023. Continue lifestyle modifications. Patent declined updating immunization for shingles, also declined statin.

## 2024-07-28 NOTE — Assessment & Plan Note (Signed)
 Foot exam completed, normal.

## 2024-07-28 NOTE — Patient Instructions (Addendum)
 Diet: Emphasize whole grains, lean proteins, fruits, and vegetables. Limit processed foods and sugary drinks.  Exercise: Continue to aim for 150 minutes of moderate aerobic activity weekly plus strength training twice a week.  YOUR MAMMOGRAM IS DUE, PLEASE CALL AND GET THIS SCHEDULED! [facility] - call [phone]

## 2024-07-28 NOTE — Assessment & Plan Note (Signed)
 Previously low vitamin D  level, normalized with daily vitamin D  supplement. She is currently on vitamin D  1,000 units daily which I recommend she continues to take daily. We will repeat vitamin D  level today.

## 2024-07-28 NOTE — Assessment & Plan Note (Signed)
 Goal LDL <70, patient was previously treated with statin for cholesterol lowering but she prefers not to take statin due to previous adverse side effects (body aches, pain). Dietary modifications advised. If LDL over 70, consider cardiology referral.

## 2024-07-28 NOTE — Assessment & Plan Note (Signed)
 Blood pressure controlled with atenolol , recent readings 127/88 mmHg. No lower leg edema, chest pain, palpitations. Home BP mostly less than 140/90 mmHg. Continue atenolol  50 mg daily, refill sent. Continue home BP monitor. Add ARB/Acei if home BP> 140/90 mmHg. Check CMP today.

## 2024-07-28 NOTE — Assessment & Plan Note (Signed)
 I did not discuss this with the patient during today's visit. No chest pain, angina. Patient not on cholesterol lowering or antiplatelet therapy.  If LDL is >70 on repeat lipid panel today I will recommend cardiology input as patient prefers not to be on statin.

## 2024-07-28 NOTE — Assessment & Plan Note (Signed)
 Screening mammogram ordered, patient will call to schedule appointment for mammogram.

## 2024-07-28 NOTE — Progress Notes (Signed)
 Established Patient Office Visit TOC from Dr. Hope    Subjective  Patient ID: Dominique James, female    DOB: 1951-06-10  Age: 73 y.o. MRN: [id number]  Chief Complaint  Patient presents with   Establish Care    She  has a past medical history of Allergy, Cerumen impaction (12/30/2023), Diarrhea (07/09/2007), Hypertriglyceridemia (11/09/2011), Muscle cramps (11/22/2022), and Myocardial infarction (HCC).  HPI Discussed the use of AI scribe software for clinical note transcription with the patient, who gave verbal consent to proceed.  History of Present Illness Dominique James is a 73 year old female who presents for a transfer of care from previous PCP and routine follow-up visit  She has a history of hypertension, managed with atenolol . She reports home blood pressure readings around 127/88 mmHg. No chest pain, palpitations, or reduced exercise tolerance. She exercises regularly, attending the gym three times a week, without experiencing leg swelling.  She reports previous A1c readings that were at or slightly above the diabetic range. Her most recent A1c was 6.1%. She carefully monitors her diet, avoiding sweets and processed foods due to digestive issues following gallbladder removal.   She has a history of hyperlipidemia and has previously taken statins, which caused muscle aches and pains. She prefers to manage her cholesterol through diet, avoiding red meat and processed foods.  She is a widow, her husband having passed away from leukemia two years ago. She maintains a good social circle and is very religious, which she finds supportive. She does not consume alcohol or smoke and has stopped caffeine intake to support her kidney health.   ROS As per HPI    Objective:     BP 120/88 (BP Location: Right Arm, Patient Position: Sitting, Cuff Size: Small)   Pulse 63   Temp 97.9 F (36.6 C) (Oral)   Ht 5' 5.5 (1.664 m)   Wt 116 lb (52.6 kg)   SpO2 97%   BMI 19.01 kg/m       07/28/2024    8:11 AM 12/25/2023    1:05 PM 11/22/2022    8:49 AM  Depression screen PHQ 2/9  Decreased Interest 0 0 0  Down, Depressed, Hopeless 0 0 0  PHQ - 2 Score 0 0 0  Altered sleeping 0 0   Tired, decreased energy 0 0   Change in appetite 0 0   Feeling bad or failure about yourself  0 0   Trouble concentrating 0 0   Moving slowly or fidgety/restless 0 0   Suicidal thoughts 0 0   PHQ-9 Score 0 0   Difficult doing work/chores Not difficult at all Not difficult at all       07/28/2024    8:11 AM 12/25/2023    1:05 PM 11/22/2022    8:49 AM 05/22/2022   11:55 AM  GAD 7 : Generalized Anxiety Score  Nervous, Anxious, on Edge 0 0 0 0  Control/stop worrying 0 0 0 0  Worry too much - different things 0 0 0 0  Trouble relaxing 0 0 0 0  Restless 0 0 0 0  Easily annoyed or irritable 0 0 0 0  Afraid - awful might happen 0 0 0 0  Total GAD 7 Score 0 0 0 0  Anxiety Difficulty Not difficult at all Not difficult at all Not difficult at all Not difficult at all      07/28/2024    8:11 AM 12/25/2023    1:05 PM 11/22/2022  8:49 AM  Depression screen PHQ 2/9  Decreased Interest 0 0 0  Down, Depressed, Hopeless 0 0 0  PHQ - 2 Score 0 0 0  Altered sleeping 0 0   Tired, decreased energy 0 0   Change in appetite 0 0   Feeling bad or failure about yourself  0 0   Trouble concentrating 0 0   Moving slowly or fidgety/restless 0 0   Suicidal thoughts 0 0   PHQ-9 Score 0 0   Difficult doing work/chores Not difficult at all Not difficult at all       07/28/2024    8:11 AM 12/25/2023    1:05 PM 11/22/2022    8:49 AM 05/22/2022   11:55 AM  GAD 7 : Generalized Anxiety Score  Nervous, Anxious, on Edge 0 0 0 0  Control/stop worrying 0 0 0 0  Worry too much - different things 0 0 0 0  Trouble relaxing 0 0 0 0  Restless 0 0 0 0  Easily annoyed or irritable 0 0 0 0  Afraid - awful might happen 0 0 0 0  Total GAD 7 Score 0 0 0 0  Anxiety Difficulty Not difficult at all Not difficult at  all Not difficult at all Not difficult at all   SDOH Screenings   Food Insecurity: No Food Insecurity (01/17/2021)  Transportation Needs: No Transportation Needs (01/17/2021)  Depression (PHQ2-9): Low Risk  (07/28/2024)  Financial Resource Strain: Low Risk  (01/17/2021)  Physical Activity: Insufficiently Active (01/17/2021)  Social Connections: Unknown (01/17/2021)  Stress: No Stress Concern Present (01/17/2021)  Tobacco Use: Low Risk  (07/28/2024)     Physical Exam Constitutional:      General: She is not in acute distress.    Appearance: Normal appearance. She is normal weight.  HENT:     Head: Normocephalic and atraumatic.     Right Ear: Tympanic membrane normal. There is no impacted cerumen.     Left Ear: Tympanic membrane normal. There is no impacted cerumen.     Mouth/Throat:     Mouth: Mucous membranes are moist.  Eyes:     Extraocular Movements: Extraocular movements intact.     Pupils: Pupils are equal, round, and reactive to light.  Neck:     Thyroid : No thyroid  mass or thyroid  tenderness.  Cardiovascular:     Rate and Rhythm: Normal rate and regular rhythm.  Pulmonary:     Effort: Pulmonary effort is normal.     Breath sounds: Normal breath sounds. No wheezing.  Abdominal:     General: Bowel sounds are normal.     Palpations: Abdomen is soft.     Tenderness: There is no abdominal tenderness.  Musculoskeletal:     Cervical back: Neck supple. No rigidity.     Right lower leg: No edema.     Left lower leg: No edema.  Lymphadenopathy:     Cervical: No cervical adenopathy.  Skin:    General: Skin is warm.  Neurological:     Mental Status: She is alert and oriented to person, place, and time.     Motor: No weakness.  Psychiatric:        Mood and Affect: Mood normal.        Behavior: Behavior normal.      Diabetic foot exam was performed with the following findings:   No deformities, ulcerations, or other skin breakdown Normal sensation of 10g  monofilament Intact posterior tibialis and dorsalis pedis pulses  No results found for any visits on 07/28/24.  The ASCVD Risk score (Arnett DK, et al., 2019) failed to calculate for the following reasons:   Risk score cannot be calculated because patient has a medical history suggesting prior/existing ASCVD     Assessment & Plan:   Hyperlipidemia associated with type 2 diabetes mellitus (HCC) Assessment & Plan: Goal LDL <70, patient was previously treated with statin for cholesterol lowering but she prefers not to take statin due to previous adverse side effects (body aches, pain). Dietary modifications advised. If LDL over 70, consider cardiology referral.    Orders: -     Lipid panel  Type 2 diabetes mellitus with complications (HCC) Assessment & Plan: Diet controlled type II DM. Will repeat A1c today.  Continue annual eye exam.  Patient's urine microalbumin normal on 12/2023. Continue lifestyle modifications. Patent declined updating immunization for shingles, also declined statin.     Orders: -     Hemoglobin A1c  Hypertension associated with diabetes (HCC) Assessment & Plan: Blood pressure controlled with atenolol , recent readings 127/88 mmHg. No lower leg edema, chest pain, palpitations. Home BP mostly less than 140/90 mmHg. Continue atenolol  50 mg daily, refill sent. Continue home BP monitor. Add ARB/Acei if home BP> 140/90 mmHg. Check CMP today.   Orders: -     Comprehensive metabolic panel with GFR -     Atenolol ; Take 1 tablet (50 mg total) by mouth daily.  Dispense: 90 tablet; Refill: 3  Encounter for screening mammogram for breast cancer -     3D Screening Mammogram, Left and Right; Future  Vitamin D  deficiency Assessment & Plan: Previously low vitamin D  level, normalized with daily vitamin D  supplement. She is currently on vitamin D  1,000 units daily which I recommend she continues to take daily. We will repeat vitamin D  level today.    Orders: -      VITAMIN D  25 Hydroxy (Vit-D Deficiency, Fractures)  Encounter for screening mammogram for malignant neoplasm of breast Assessment & Plan: Screening mammogram ordered, patient will call to schedule appointment for mammogram.    Encounter for diabetic foot exam [facility]) Assessment & Plan: Foot exam completed, normal.      Return in about 6 months (around 01/25/2025) for F/U in Dr. Abbey in 6 months, nurse call for medicare visit.   Luke Abbey, MD

## 2024-07-28 NOTE — Progress Notes (Signed)
 Patient updated on lab results via phone call. Given elevated LDL above goal, h/o MI trial of Pravastatin 10 mg daily, start Aspirin 81 mg daily recommended. Patient agrees and will update us  if she develops side effects like muscle pain, dark urine, right upper quadrant pain, hives. Stable A1c, normal vitamin D , stable renal and liver function results discussed.   1. MYOCARDIAL INFARCTION, HX OF (Primary) - pravastatin (PRAVACHOL) 10 MG tablet; Take 1 tablet (10 mg total) by mouth daily.  Dispense: 90 tablet; Refill: 3 - aspirin EC 81 MG tablet; Take 1 tablet (81 mg total) by mouth daily. Swallow whole.  Luke Shade, MD

## 2024-07-30 ENCOUNTER — Ambulatory Visit

## 2024-07-30 VITALS — BP 134/70 | Ht 65.5 in | Wt 115.0 lb

## 2024-07-30 DIAGNOSIS — Z0000 Encounter for general adult medical examination without abnormal findings: Secondary | ICD-10-CM

## 2024-07-30 NOTE — Patient Instructions (Signed)
 [name],  Thank you for taking the time for your Medicare Wellness Visit. I appreciate your continued commitment to your health goals. Please review the care plan we discussed, and feel free to reach out if I can assist you further.  Medicare recommends these wellness visits once per year to help you and your care team stay ahead of potential health issues. These visits are designed to focus on prevention, allowing your provider to concentrate on managing your acute and chronic conditions during your regular appointments.  Please note that Annual Wellness Visits do not include a physical exam. Some assessments may be limited, especially if the visit was conducted virtually. If needed, we may recommend a separate in-person follow-up with your provider.  Ongoing Care Seeing your primary care provider every 3 to 6 months helps us  monitor your health and provide consistent, personalized care.  Please call the telephone number provided and schedule your mammogram that was ordered 07/28/24. Remember to call and schedule a diabetic eye exam.   Referrals If a referral was made during today's visit and you haven't received any updates within two weeks, please contact the referred provider directly to check on the status.  Recommended Screenings:  Health Maintenance  Topic Date Due   Breast Cancer Screening  12/14/2023   Eye exam for diabetics  02/21/2024   Zoster (Shingles) Vaccine (1 of 2) 10/27/2024*   DTaP/Tdap/Td vaccine (2 - Tdap) 12/24/2024*   Pneumococcal Vaccine for age over 89 (2 of 2 - PCV) 12/24/2024*   DEXA scan (bone density measurement)  12/24/2024*   Flu Shot  01/27/2025*   Yearly kidney health urinalysis for diabetes  12/24/2024   Cologuard (Stool DNA test)  01/09/2025   Hemoglobin A1C  01/25/2025   Yearly kidney function blood test for diabetes  07/28/2025   Complete foot exam   07/28/2025   Medicare Annual Wellness Visit  07/30/2025   Hepatitis C Screening  Completed   HPV  Vaccine  Aged Out   Meningitis B Vaccine  Aged Out   COVID-19 Vaccine  Discontinued  *Topic was postponed. The date shown is not the original due date.       07/30/2024    2:20 PM  Advanced Directives  Does Patient Have a Medical Advance Directive? Yes  Type of Estate agent of [place];Living will  Does patient want to make changes to medical advance directive? No - Patient declined  Copy of Healthcare Power of Attorney in Chart? Yes - validated most recent copy scanned in chart (See row information)   Advance Care Planning is important because it: Ensures you receive medical care that aligns with your values, goals, and preferences. Provides guidance to your family and loved ones, reducing the emotional burden of decision-making during critical moments.  Vision: Annual vision screenings are recommended for early detection of glaucoma, cataracts, and diabetic retinopathy. These exams can also reveal signs of chronic conditions such as diabetes and high blood pressure.  Dental: Annual dental screenings help detect early signs of oral cancer, gum disease, and other conditions linked to overall health, including heart disease and diabetes.  Please see the attached documents for additional preventive care recommendations.

## 2024-07-30 NOTE — Progress Notes (Signed)
 Subjective:   Dominique James is a 73 y.o. who presents for a Medicare Wellness preventive visit.  As a reminder, Annual Wellness Visits don't include a physical exam, and some assessments may be limited, especially if this visit is performed virtually. We may recommend an in-person follow-up visit with your provider if needed.  Visit Complete: Virtual I connected with  Tilton DELENA Lung on 07/30/24 by a audio enabled telemedicine application and verified that I am speaking with the correct person using two identifiers.  Patient Location: Home  Provider Location: Home Office  I discussed the limitations of evaluation and management by telemedicine. The patient expressed understanding and agreed to proceed.  Vital Signs: Because this visit was a virtual/telehealth visit, some criteria may be missing or patient reported. Any vitals not documented were not able to be obtained and vitals that have been documented are patient reported.  VideoDeclined- This patient declined Librarian, academic. Therefore the visit was completed with audio only.  Persons Participating in Visit: Patient.  AWV Questionnaire: No: Patient Medicare AWV questionnaire was not completed prior to this visit.  Cardiac Risk Factors include: advanced age (>32men, >12 women);diabetes mellitus;dyslipidemia;hypertension     Objective:    Today's Vitals   07/30/24 1406  BP: 134/70  Weight: 115 lb (52.2 kg)  Height: 5' 5.5 (1.664 m)   Body mass index is 18.85 kg/m.     07/30/2024    2:20 PM 01/17/2021    8:44 AM  Advanced Directives  Does Patient Have a Medical Advance Directive? Yes No  Type of Estate agent of [place];Living will   Does patient want to make changes to medical advance directive? No - Patient declined   Copy of Healthcare Power of Attorney in Chart? Yes - validated most recent copy scanned in chart (See row information)   Would patient like  information on creating a medical advance directive?  No - Patient declined    Current Medications (verified) Outpatient Encounter Medications as of 07/30/2024  Medication Sig   aspirin EC 81 MG tablet Take 1 tablet (81 mg total) by mouth daily. Swallow whole.   atenolol  (TENORMIN ) 50 MG tablet Take 1 tablet (50 mg total) by mouth daily.   cholecalciferol (VITAMIN D3) 25 MCG (1000 UNIT) tablet Take 1,000 Units by mouth daily.   pravastatin (PRAVACHOL) 10 MG tablet Take 1 tablet (10 mg total) by mouth daily.   No facility-administered encounter medications on file as of 07/30/2024.    Allergies (verified) Alendronate sodium, Atorvastatin, Ezetimibe, Mobic [meloxicam], Penicillins, Rosuvastatin, and Simvastatin   History: Past Medical History:  Diagnosis Date   Allergy    Cerumen impaction 12/30/2023   Diarrhea 07/09/2007   Qualifier: Diagnosis of   By: Baird FNP, Frieda Kipper        Hypertriglyceridemia 11/09/2011   Muscle cramps 11/22/2022   Myocardial infarction [facility])    Past Surgical History:  Procedure Laterality Date   BREAST CYST ASPIRATION     left   CATARACT EXTRACTION Bilateral 07/2023   CHOLECYSTECTOMY     endocervical polyp  06/1998   removed   GANGLION CYST EXCISION  02/2007   left knee   Family History  Problem Relation Age of Onset   Cancer Father 38       throat   Cancer Mother        colon   Diabetes Mother    Hypertension Mother    Fibromyalgia Sister    Heart disease Brother  Alcohol abuse Son    Drug abuse Son    Cancer Maternal Grandmother        brest   Breast cancer Maternal Grandmother        late 12's early 9's   Anemia Brother    Social History   Socioeconomic History   Marital status: Widowed    Spouse name: Not on file   Number of children: 2   Years of education: Not on file   Highest education level: Not on file  Occupational History   Occupation: Retired from teaching  Tobacco Use   Smoking status: Never    Smokeless tobacco: Never  Substance and Sexual Activity   Alcohol use: Not Currently   Drug use: Not on file   Sexual activity: Not on file  Other Topics Concern   Not on file  Social History Narrative   Not on file   Social Drivers of Health   Financial Resource Strain: Low Risk  (07/30/2024)   Overall Financial Resource Strain (CARDIA)    Difficulty of Paying Living Expenses: Not hard at all  Food Insecurity: No Food Insecurity (07/30/2024)   Hunger Vital Sign    Worried About Running Out of Food in the Last Year: Never true    Ran Out of Food in the Last Year: Never true  Transportation Needs: No Transportation Needs (07/30/2024)   PRAPARE - Administrator, Civil Service (Medical): No    Lack of Transportation (Non-Medical): No  Physical Activity: Sufficiently Active (07/30/2024)   Exercise Vital Sign    Days of Exercise per Week: 4 days    Minutes of Exercise per Session: 60 min  Stress: No Stress Concern Present (07/30/2024)   [employer] of Occupational Health - Occupational Stress Questionnaire    Feeling of Stress: Not at all  Social Connections: Moderately Isolated (07/30/2024)   Social Connection and Isolation Panel    Frequency of Communication with Friends and Family: More than three times a week    Frequency of Social Gatherings with Friends and Family: More than three times a week    Attends Religious Services: Never    Database administrator or Organizations: Yes    Attends Engineer, structural: More than 4 times per year    Marital Status: Widowed    Tobacco Counseling Counseling given: Not Answered    Clinical Intake:  Pre-visit preparation completed: Yes  Pain : No/denies pain     BMI - recorded: 18.85 Nutritional Status: BMI <19  Underweight Nutritional Risks: None Diabetes: Yes CBG done?: No  Lab Results  Component Value Date   HGBA1C 6.3 07/28/2024   HGBA1C 6.1 12/25/2023   HGBA1C 6.1 05/23/2023     How often  do you need to have someone help you when you read instructions, pamphlets, or other written materials from your doctor or pharmacy?: 1 - Never  Interpreter Needed?: No  Information entered by :: R. Daysie Helf LPN   Activities of Daily Living     07/30/2024    2:08 PM  In your present state of health, do you have any difficulty performing the following activities:  Hearing? 0  Vision? 0  Difficulty concentrating or making decisions? 0  Walking or climbing stairs? 0  Dressing or bathing? 0  Doing errands, shopping? 0  Preparing Food and eating ? N  Using the Toilet? N  In the past six months, have you accidently leaked urine? N  Do you have problems with  loss of bowel control? N  Managing your Medications? N  Managing your Finances? N  Housekeeping or managing your Housekeeping? N    Patient Care Team: Bair, Kalpana, MD as PCP - General (Family Medicine) Maribeth Camellia MATSU, MD (Inactive) as Consulting Physician (Family Medicine) Dellie Louanne MATSU, MD (General Surgery) Dasher, Alm LABOR, MD (Dermatology)  I have updated your Care Teams any recent Medical Services you may have received from other providers in the past year.     Assessment:   This is a routine wellness examination for [place].  Hearing/Vision screen Hearing Screening - Comments:: No issues Vision Screening - Comments:: readers   Goals Addressed             This Visit's Progress    Patient Stated       Wants to go to the gym more and find a church home       Depression Screen     07/30/2024    2:15 PM 07/28/2024    8:11 AM 12/25/2023    1:05 PM 11/22/2022    8:49 AM 05/22/2022   11:55 AM 01/17/2021    8:56 AM 02/20/2020    1:53 PM  PHQ 2/9 Scores  PHQ - 2 Score 0 0 0 0 0 0 0  PHQ- 9 Score 0 0 0        Fall Risk     07/30/2024    2:11 PM 07/28/2024    8:11 AM 12/25/2023   12:48 PM 05/23/2023    8:58 AM 11/22/2022    8:49 AM  Fall Risk   Falls in the past year? 1 0 0 0 0  Number falls in past  yr: 0 0 0 0 0  Injury with Fall? 0 0 0 0 0  Risk for fall due to : History of fall(s);Impaired balance/gait No Fall Risks No Fall Risks  History of fall(s)  Follow up Falls evaluation completed;Falls prevention discussed Falls evaluation completed Falls evaluation completed;Education provided Falls evaluation completed Falls evaluation completed      Data saved with a previous flowsheet row definition    MEDICARE RISK AT HOME:  Medicare Risk at Home Any stairs in or around the home?: Yes If so, are there any without handrails?: No Home free of loose throw rugs in walkways, pet beds, electrical cords, etc?: Yes Adequate lighting in your home to reduce risk of falls?: Yes Life alert?: No Use of a cane, walker or w/c?: No Grab bars in the bathroom?: Yes Shower chair or bench in shower?: Yes Elevated toilet seat or a handicapped toilet?: Yes  TIMED UP AND GO:  Was the test performed?  No  Cognitive Function: 6CIT completed        07/30/2024    2:21 PM  6CIT Screen  What Year? 0 points  What month? 0 points  What time? 0 points  Count back from 20 0 points  Months in reverse 0 points  Repeat phrase 0 points  Total Score 0 points    Immunizations Immunization History  Administered Date(s) Administered   Pneumococcal Polysaccharide-23 08/16/2000   Td 03/04/2009    Screening Tests Health Maintenance  Topic Date Due   Medicare Annual Wellness (AWV)  01/17/2022   Mammogram  12/14/2023   OPHTHALMOLOGY EXAM  02/21/2024   Zoster Vaccines- Shingrix (1 of 2) 10/27/2024 (Originally 11/13/1969)   DTaP/Tdap/Td (2 - Tdap) 12/24/2024 (Originally 03/05/2019)   Pneumococcal Vaccine: 50+ Years (2 of 2 - PCV) 12/24/2024 (Originally 08/16/2001)  DEXA SCAN  12/24/2024 (Originally 11/13/2014)   Influenza Vaccine  01/27/2025 (Originally 05/30/2024)   Diabetic kidney evaluation - Urine ACR  12/24/2024   Fecal DNA (Cologuard)  01/09/2025   HEMOGLOBIN A1C  01/25/2025   Diabetic kidney  evaluation - eGFR measurement  07/28/2025   FOOT EXAM  07/28/2025   Hepatitis C Screening  Completed   HPV VACCINES  Aged Out   Meningococcal B Vaccine  Aged Out   COVID-19 Vaccine  Discontinued    Health Maintenance Items Addressed: Patient stated that she will call and schedule her mammogram that was ordered 07/28/24 telephone number provided. Patient declines vaccines and Dexa.   Additional Screening:  Vision Screening: Recommended annual ophthalmology exams for early detection of glaucoma and other disorders of the eye. Is the patient up to date with their annual eye exam?  No  Who is the provider or what is the name of the office in which the patient attends annual eye exams?  [facility]   Patient stated that she will call and schedule a diabetic eye exam.  Dental Screening: Recommended annual dental exams for proper oral hygiene  Community Resource Referral / Chronic Care Management: CRR required this visit?  No   CCM required this visit?  No   Plan:    I have personally reviewed and noted the following in the patient's chart:   Medical and social history Use of alcohol, tobacco or illicit drugs  Current medications and supplements including opioid prescriptions. Patient is not currently taking opioid prescriptions. Functional ability and status Nutritional status Physical activity Advanced directives List of other physicians Hospitalizations, surgeries, and ER visits in previous 12 months Vitals Screenings to include cognitive, depression, and falls Referrals and appointments  In addition, I have reviewed and discussed with patient certain preventive protocols, quality metrics, and best practice recommendations. A written personalized care plan for preventive services as well as general preventive health recommendations were provided to patient.   Angeline Fredericks, LPN   [id number]/[id number]   After Visit Summary: (MyChart) Due to this being a telephonic visit, the after  visit summary with patients personalized plan was offered to patient via MyChart   Notes: Nothing significant to report at this time.

## 2024-08-27 ENCOUNTER — Ambulatory Visit
Admission: RE | Admit: 2024-08-27 | Discharge: 2024-08-27 | Disposition: A | Discharge status: Home / Self Care | Source: Ambulatory Visit

## 2024-08-27 DIAGNOSIS — Z1231 Encounter for screening mammogram for malignant neoplasm of breast: Secondary | ICD-10-CM | POA: Diagnosis present

## 2025-01-26 ENCOUNTER — Ambulatory Visit

## 2025-08-03 ENCOUNTER — Ambulatory Visit
